# Patient Record
Sex: Male | Born: 1955
Health system: Southern US, Community
[De-identification: ages and names within clinical notes are randomized; demographics above are authoritative.]

## PROBLEM LIST (undated history)

## (undated) DIAGNOSIS — J302 Other seasonal allergic rhinitis: Secondary | ICD-10-CM

## (undated) DIAGNOSIS — C4490 Unspecified malignant neoplasm of skin, unspecified: Secondary | ICD-10-CM

## (undated) DIAGNOSIS — T7840XA Allergy, unspecified, initial encounter: Secondary | ICD-10-CM

## (undated) DIAGNOSIS — D128 Benign neoplasm of rectum: Secondary | ICD-10-CM

## (undated) HISTORY — PX: VASECTOMY: SHX75

## (undated) HISTORY — PX: OTHER SURGICAL HISTORY: SHX169

## (undated) HISTORY — DX: Unspecified malignant neoplasm of skin, unspecified: C44.90

## (undated) HISTORY — DX: Other seasonal allergic rhinitis: J30.2

## (undated) HISTORY — DX: Allergy, unspecified, initial encounter: T78.40XA

---

## 2004-12-12 ENCOUNTER — Ambulatory Visit: Payer: Self-pay | Admitting: Gastroenterology

## 2004-12-13 ENCOUNTER — Ambulatory Visit: Payer: Self-pay | Admitting: Gastroenterology

## 2004-12-13 ENCOUNTER — Encounter (INDEPENDENT_AMBULATORY_CARE_PROVIDER_SITE_OTHER): Payer: Self-pay | Admitting: Specialist

## 2005-01-12 ENCOUNTER — Ambulatory Visit: Payer: Self-pay | Admitting: Gastroenterology

## 2005-03-23 ENCOUNTER — Ambulatory Visit (HOSPITAL_COMMUNITY): Admission: RE | Admit: 2005-03-23 | Discharge: 2005-03-23 | Payer: Self-pay | Admitting: *Deleted

## 2007-05-14 ENCOUNTER — Ambulatory Visit: Payer: Self-pay | Admitting: Gastroenterology

## 2007-05-28 ENCOUNTER — Ambulatory Visit: Payer: Self-pay | Admitting: Gastroenterology

## 2008-03-16 ENCOUNTER — Ambulatory Visit (HOSPITAL_BASED_OUTPATIENT_CLINIC_OR_DEPARTMENT_OTHER): Admission: RE | Admit: 2008-03-16 | Discharge: 2008-03-16 | Payer: Self-pay | Admitting: Specialist

## 2010-11-14 NOTE — Op Note (Signed)
Rodney Whitney, Rodney Whitney NO.:  [id number]   MEDICAL RECORD NO.:  [id number]          PATIENT TYPE:  AMB   LOCATION:  [facility]                         FACILITY:  [facility]   PHYSICIAN:  Jene Every, M.D.    DATE OF BIRTH:  1955-12-07   DATE OF PROCEDURE:  03/16/2008  DATE OF DISCHARGE:                               OPERATIVE REPORT   PREOPERATIVE DIAGNOSIS:  Medial meniscus tear right knee.   POSTOPERATIVE DIAGNOSIS:  Medial meniscus tear right knee, grade III  chondromalacia lateral femoral condyle, grade III chondromalacia medial  tibial plateau.   PROCEDURE:  1. Right knee arthroscopy.  2. Partial medial meniscectomy.  3. Chondroplasty of lateral femoral condyle, medial tibial plateau.   ANESTHESIA:  General   SURGEON:  Jene Every, M.D.   ASSISTANT:  None.   BRIEF HISTORY AND INDICATION:  A 55 year old with mechanical symptoms,  medial meniscus tear noted, indicated for partial resection.  Risks and  benefits discussed including bleeding, infection, damage to  neurovascular structures, no change in symptoms, worsening of symptoms,  need for repeat debridement, anesthetic complication, DVT, PE, etc.   PROCEDURE IN DETAIL:  With the patient in supine position after  induction of adequate level of general anesthesia, 2 grams of Kefzol,  the right lower extremity was prepped and draped in the usual sterile  fashion.  A lateral parapatellar portal and a superomedial parapatellar  portal was fashioned with a #11 blade.  Ingress cannula atraumatically  placed.  Irrigant was utilized to insufflate the joint.  Under direct  visualization a medial parapatellar portal was fashioned with a #11  blade after localization with an 18 gauge needle sparing the medial  meniscus.  Noted was a complex tear of the posterior half of the medial  meniscus.  Straight basket rongeurs was introduced and utilized to  perform a partial medial meniscectomy to a stable base.  The inner  one  half of the posterior half of the medial meniscus was resected.  It was  not repairable.  Next the lateral compartment revealed interestingly a  grade III lesion along the posterolateral femoral condyle with chondral  flap tears noted.  These were excised with a straight basket and  contoured to a stable base utilizing a 4.2 Cuda shaver.  The mild radial  tearing of the lateral meniscus was shaved with a 4.2 Cuda shaver.  The  remaining remnant of the meniscus was stable to probe palpation and  tibial plateau was unremarkable.  ACL and PCL were unremarkable.  Mild  patellofemoral degenerative changes.  Normal patellofemoral articulation  back to the medial compartment.  A remnant stable to probe palpation.  Some grade III changes of the tibial plateau.  Chondroplasty was  performed here as well.  There was no further pathology amenable to  surgical intervention.  The knee was copiously lavaged, all  instrumentation was removed.  Portals were closed  with 4-0 nylon simple sutures and quarter percent Marcaine with  epinephrine was infiltrated in the joint.  The wound was dressed  sterilely.  He was awakened without difficulty and transported to  the  recovery room in satisfactory condition.  The patient tolerated the  procedure with no complication.      Jene Every, M.D.  Electronically Signed     JB/MEDQ  D:  03/16/2008  T:  03/18/2008  Job:  [id number]

## 2011-08-28 ENCOUNTER — Other Ambulatory Visit: Payer: Self-pay | Admitting: Dermatology

## 2014-07-28 ENCOUNTER — Other Ambulatory Visit (HOSPITAL_COMMUNITY): Payer: Self-pay | Admitting: Urology

## 2014-07-28 DIAGNOSIS — R868 Other abnormal findings in specimens from male genital organs: Secondary | ICD-10-CM

## 2014-07-28 DIAGNOSIS — R361 Hematospermia: Secondary | ICD-10-CM

## 2014-07-28 DIAGNOSIS — N5089 Other specified disorders of the male genital organs: Secondary | ICD-10-CM

## 2014-08-13 ENCOUNTER — Ambulatory Visit (HOSPITAL_COMMUNITY): Payer: BLUE CROSS/BLUE SHIELD

## 2015-07-18 ENCOUNTER — Other Ambulatory Visit: Payer: Self-pay | Admitting: Otolaryngology

## 2015-07-19 ENCOUNTER — Other Ambulatory Visit: Payer: Self-pay | Admitting: Otolaryngology

## 2015-07-19 DIAGNOSIS — H9190 Unspecified hearing loss, unspecified ear: Secondary | ICD-10-CM

## 2015-07-28 ENCOUNTER — Ambulatory Visit
Admission: RE | Admit: 2015-07-28 | Discharge: 2015-07-28 | Disposition: A | Discharge status: Home / Self Care | Payer: BLUE CROSS/BLUE SHIELD | Source: Ambulatory Visit | Attending: Otolaryngology | Admitting: Otolaryngology

## 2015-07-28 DIAGNOSIS — H9190 Unspecified hearing loss, unspecified ear: Secondary | ICD-10-CM

## 2016-03-28 DIAGNOSIS — L570 Actinic keratosis: Secondary | ICD-10-CM | POA: Diagnosis not present

## 2016-03-28 DIAGNOSIS — D225 Melanocytic nevi of trunk: Secondary | ICD-10-CM | POA: Diagnosis not present

## 2016-03-28 DIAGNOSIS — I788 Other diseases of capillaries: Secondary | ICD-10-CM | POA: Diagnosis not present

## 2016-03-28 DIAGNOSIS — L814 Other melanin hyperpigmentation: Secondary | ICD-10-CM | POA: Diagnosis not present

## 2016-03-28 DIAGNOSIS — D224 Melanocytic nevi of scalp and neck: Secondary | ICD-10-CM | POA: Diagnosis not present

## 2016-04-25 DIAGNOSIS — Z23 Encounter for immunization: Secondary | ICD-10-CM | POA: Diagnosis not present

## 2016-04-25 DIAGNOSIS — Z859 Personal history of malignant neoplasm, unspecified: Secondary | ICD-10-CM | POA: Diagnosis not present

## 2016-04-25 DIAGNOSIS — N442 Benign cyst of testis: Secondary | ICD-10-CM | POA: Diagnosis not present

## 2016-04-25 DIAGNOSIS — J302 Other seasonal allergic rhinitis: Secondary | ICD-10-CM | POA: Diagnosis not present

## 2016-05-01 DIAGNOSIS — N4341 Spermatocele of epididymis, single: Secondary | ICD-10-CM | POA: Diagnosis not present

## 2016-05-01 DIAGNOSIS — R946 Abnormal results of thyroid function studies: Secondary | ICD-10-CM | POA: Diagnosis not present

## 2016-05-01 DIAGNOSIS — Z0000 Encounter for general adult medical examination without abnormal findings: Secondary | ICD-10-CM | POA: Diagnosis not present

## 2016-05-01 DIAGNOSIS — N5201 Erectile dysfunction due to arterial insufficiency: Secondary | ICD-10-CM | POA: Diagnosis not present

## 2016-05-01 DIAGNOSIS — Z125 Encounter for screening for malignant neoplasm of prostate: Secondary | ICD-10-CM | POA: Diagnosis not present

## 2016-05-08 DIAGNOSIS — R1310 Dysphagia, unspecified: Secondary | ICD-10-CM | POA: Diagnosis not present

## 2016-05-08 DIAGNOSIS — Z1389 Encounter for screening for other disorder: Secondary | ICD-10-CM | POA: Diagnosis not present

## 2016-05-08 DIAGNOSIS — Z859 Personal history of malignant neoplasm, unspecified: Secondary | ICD-10-CM | POA: Diagnosis not present

## 2016-05-08 DIAGNOSIS — N442 Benign cyst of testis: Secondary | ICD-10-CM | POA: Diagnosis not present

## 2016-05-08 DIAGNOSIS — Z0000 Encounter for general adult medical examination without abnormal findings: Secondary | ICD-10-CM | POA: Diagnosis not present

## 2016-09-10 DIAGNOSIS — Z6830 Body mass index (BMI) 30.0-30.9, adult: Secondary | ICD-10-CM | POA: Diagnosis not present

## 2016-09-10 DIAGNOSIS — R202 Paresthesia of skin: Secondary | ICD-10-CM | POA: Diagnosis not present

## 2016-09-10 DIAGNOSIS — M25512 Pain in left shoulder: Secondary | ICD-10-CM | POA: Diagnosis not present

## 2016-10-16 DIAGNOSIS — D0462 Carcinoma in situ of skin of left upper limb, including shoulder: Secondary | ICD-10-CM | POA: Diagnosis not present

## 2016-10-16 DIAGNOSIS — L570 Actinic keratosis: Secondary | ICD-10-CM | POA: Diagnosis not present

## 2016-12-10 DIAGNOSIS — Z6829 Body mass index (BMI) 29.0-29.9, adult: Secondary | ICD-10-CM | POA: Diagnosis not present

## 2016-12-10 DIAGNOSIS — Z91030 Bee allergy status: Secondary | ICD-10-CM | POA: Diagnosis not present

## 2017-04-01 DIAGNOSIS — Z85828 Personal history of other malignant neoplasm of skin: Secondary | ICD-10-CM | POA: Diagnosis not present

## 2017-04-01 DIAGNOSIS — L570 Actinic keratosis: Secondary | ICD-10-CM | POA: Diagnosis not present

## 2017-04-01 DIAGNOSIS — L821 Other seborrheic keratosis: Secondary | ICD-10-CM | POA: Diagnosis not present

## 2017-04-01 DIAGNOSIS — L720 Epidermal cyst: Secondary | ICD-10-CM | POA: Diagnosis not present

## 2017-04-18 DIAGNOSIS — R946 Abnormal results of thyroid function studies: Secondary | ICD-10-CM | POA: Diagnosis not present

## 2017-04-18 DIAGNOSIS — Z125 Encounter for screening for malignant neoplasm of prostate: Secondary | ICD-10-CM | POA: Diagnosis not present

## 2017-04-18 DIAGNOSIS — Z0000 Encounter for general adult medical examination without abnormal findings: Secondary | ICD-10-CM | POA: Diagnosis not present

## 2017-04-18 DIAGNOSIS — Z23 Encounter for immunization: Secondary | ICD-10-CM | POA: Diagnosis not present

## 2017-05-08 DIAGNOSIS — N5201 Erectile dysfunction due to arterial insufficiency: Secondary | ICD-10-CM | POA: Diagnosis not present

## 2017-05-08 DIAGNOSIS — Z125 Encounter for screening for malignant neoplasm of prostate: Secondary | ICD-10-CM | POA: Diagnosis not present

## 2017-05-14 DIAGNOSIS — J019 Acute sinusitis, unspecified: Secondary | ICD-10-CM | POA: Diagnosis not present

## 2017-05-14 DIAGNOSIS — M48061 Spinal stenosis, lumbar region without neurogenic claudication: Secondary | ICD-10-CM | POA: Diagnosis not present

## 2017-05-14 DIAGNOSIS — Z859 Personal history of malignant neoplasm, unspecified: Secondary | ICD-10-CM | POA: Diagnosis not present

## 2017-05-14 DIAGNOSIS — J302 Other seasonal allergic rhinitis: Secondary | ICD-10-CM | POA: Diagnosis not present

## 2017-05-14 DIAGNOSIS — Z1389 Encounter for screening for other disorder: Secondary | ICD-10-CM | POA: Diagnosis not present

## 2017-05-14 DIAGNOSIS — Z0000 Encounter for general adult medical examination without abnormal findings: Secondary | ICD-10-CM | POA: Diagnosis not present

## 2017-05-22 ENCOUNTER — Encounter: Payer: Self-pay | Admitting: Gastroenterology

## 2017-06-13 ENCOUNTER — Encounter: Payer: Self-pay | Admitting: Internal Medicine

## 2017-06-28 DIAGNOSIS — H5212 Myopia, left eye: Secondary | ICD-10-CM | POA: Diagnosis not present

## 2017-07-18 DIAGNOSIS — H43821 Vitreomacular adhesion, right eye: Secondary | ICD-10-CM | POA: Diagnosis not present

## 2017-07-18 DIAGNOSIS — H2511 Age-related nuclear cataract, right eye: Secondary | ICD-10-CM | POA: Diagnosis not present

## 2017-07-18 DIAGNOSIS — H52223 Regular astigmatism, bilateral: Secondary | ICD-10-CM | POA: Diagnosis not present

## 2017-08-16 DIAGNOSIS — H35371 Puckering of macula, right eye: Secondary | ICD-10-CM | POA: Diagnosis not present

## 2017-08-16 DIAGNOSIS — H2511 Age-related nuclear cataract, right eye: Secondary | ICD-10-CM | POA: Diagnosis not present

## 2018-04-01 DIAGNOSIS — L570 Actinic keratosis: Secondary | ICD-10-CM | POA: Diagnosis not present

## 2018-04-01 DIAGNOSIS — L814 Other melanin hyperpigmentation: Secondary | ICD-10-CM | POA: Diagnosis not present

## 2018-04-01 DIAGNOSIS — L821 Other seborrheic keratosis: Secondary | ICD-10-CM | POA: Diagnosis not present

## 2018-04-01 DIAGNOSIS — C4441 Basal cell carcinoma of skin of scalp and neck: Secondary | ICD-10-CM | POA: Diagnosis not present

## 2018-04-01 DIAGNOSIS — D225 Melanocytic nevi of trunk: Secondary | ICD-10-CM | POA: Diagnosis not present

## 2018-04-01 DIAGNOSIS — C44212 Basal cell carcinoma of skin of right ear and external auricular canal: Secondary | ICD-10-CM | POA: Diagnosis not present

## 2018-05-07 DIAGNOSIS — C44212 Basal cell carcinoma of skin of right ear and external auricular canal: Secondary | ICD-10-CM | POA: Diagnosis not present

## 2018-05-12 DIAGNOSIS — Z0000 Encounter for general adult medical examination without abnormal findings: Secondary | ICD-10-CM | POA: Diagnosis not present

## 2018-05-12 DIAGNOSIS — R82998 Other abnormal findings in urine: Secondary | ICD-10-CM | POA: Diagnosis not present

## 2018-05-12 DIAGNOSIS — R946 Abnormal results of thyroid function studies: Secondary | ICD-10-CM | POA: Diagnosis not present

## 2018-05-15 DIAGNOSIS — Z125 Encounter for screening for malignant neoplasm of prostate: Secondary | ICD-10-CM | POA: Diagnosis not present

## 2018-05-15 DIAGNOSIS — N5201 Erectile dysfunction due to arterial insufficiency: Secondary | ICD-10-CM | POA: Diagnosis not present

## 2018-05-20 DIAGNOSIS — Z0000 Encounter for general adult medical examination without abnormal findings: Secondary | ICD-10-CM | POA: Diagnosis not present

## 2018-05-20 DIAGNOSIS — M25511 Pain in right shoulder: Secondary | ICD-10-CM | POA: Diagnosis not present

## 2018-05-20 DIAGNOSIS — R1319 Other dysphagia: Secondary | ICD-10-CM | POA: Diagnosis not present

## 2018-05-20 DIAGNOSIS — N442 Benign cyst of testis: Secondary | ICD-10-CM | POA: Diagnosis not present

## 2018-05-20 DIAGNOSIS — Z1212 Encounter for screening for malignant neoplasm of rectum: Secondary | ICD-10-CM | POA: Diagnosis not present

## 2018-05-20 DIAGNOSIS — Z23 Encounter for immunization: Secondary | ICD-10-CM | POA: Diagnosis not present

## 2018-05-20 DIAGNOSIS — Z859 Personal history of malignant neoplasm, unspecified: Secondary | ICD-10-CM | POA: Diagnosis not present

## 2018-05-20 DIAGNOSIS — Z1389 Encounter for screening for other disorder: Secondary | ICD-10-CM | POA: Diagnosis not present

## 2018-05-21 DIAGNOSIS — Z4802 Encounter for removal of sutures: Secondary | ICD-10-CM | POA: Diagnosis not present

## 2018-07-03 ENCOUNTER — Encounter: Payer: Self-pay | Admitting: Gastroenterology

## 2018-08-25 ENCOUNTER — Encounter: Payer: BLUE CROSS/BLUE SHIELD | Admitting: Gastroenterology

## 2018-09-10 ENCOUNTER — Encounter: Payer: Self-pay | Admitting: Gastroenterology

## 2018-09-10 ENCOUNTER — Other Ambulatory Visit: Payer: Self-pay

## 2018-09-10 ENCOUNTER — Ambulatory Visit (AMBULATORY_SURGERY_CENTER): Payer: Self-pay | Admitting: *Deleted

## 2018-09-10 VITALS — Ht 71.0 in | Wt 210.8 lb

## 2018-09-10 DIAGNOSIS — Z1211 Encounter for screening for malignant neoplasm of colon: Secondary | ICD-10-CM

## 2018-09-10 MED ORDER — NA SULFATE-K SULFATE-MG SULF 17.5-3.13-1.6 GM/177ML PO SOLN: 1.0000 | Freq: Once | ORAL | 0 refills | Status: AC

## 2018-09-10 NOTE — Progress Notes (Signed)
Denies allergies to eggs or soy products. Denies complications with sedation or anesthesia. Denies O2 use. Denies use of diet or weight loss medications.  Emmi instructions given for colonoscopy.  

## 2018-09-24 ENCOUNTER — Encounter: Payer: BLUE CROSS/BLUE SHIELD | Admitting: Gastroenterology

## 2018-10-07 ENCOUNTER — Telehealth: Payer: Self-pay | Admitting: *Deleted

## 2018-10-07 NOTE — Telephone Encounter (Signed)
Left second message regarding canceling his colonoscopy due to Covid 19. No answer. Will cancel procedure. SM

## 2018-10-07 NOTE — Telephone Encounter (Signed)
No answer and left message to reschedule the patient for later appointment time related to COVID 19. Will await pt to call for confirmation. SM

## 2018-10-15 ENCOUNTER — Encounter: Payer: BLUE CROSS/BLUE SHIELD | Admitting: Gastroenterology

## 2018-10-21 DIAGNOSIS — M79662 Pain in left lower leg: Secondary | ICD-10-CM | POA: Diagnosis not present

## 2018-11-11 ENCOUNTER — Telehealth: Payer: Self-pay | Admitting: *Deleted

## 2018-11-11 NOTE — Telephone Encounter (Signed)
No answer for the second attempt for his COVID 19 screening. Left pt a message regarding the care partner policy and also told him to bring a mask with him if he has one available. SM

## 2018-11-11 NOTE — Telephone Encounter (Signed)
No answer for the first attempt of the COVID screening. Will call back. SM

## 2018-11-12 ENCOUNTER — Telehealth: Payer: Self-pay | Admitting: Gastroenterology

## 2018-11-12 ENCOUNTER — Encounter: Payer: BLUE CROSS/BLUE SHIELD | Admitting: Gastroenterology

## 2018-11-12 NOTE — Telephone Encounter (Signed)
Okay thanks for letting me know

## 2018-12-18 DIAGNOSIS — M7662 Achilles tendinitis, left leg: Secondary | ICD-10-CM | POA: Diagnosis not present

## 2019-03-02 ENCOUNTER — Encounter: Payer: Self-pay | Admitting: Gastroenterology

## 2019-03-13 DIAGNOSIS — Z23 Encounter for immunization: Secondary | ICD-10-CM | POA: Diagnosis not present

## 2019-03-23 ENCOUNTER — Other Ambulatory Visit: Payer: Self-pay

## 2019-03-23 ENCOUNTER — Ambulatory Visit (AMBULATORY_SURGERY_CENTER): Payer: Self-pay

## 2019-03-23 VITALS — Temp 96.6°F | Ht 71.0 in | Wt 201.0 lb

## 2019-03-23 DIAGNOSIS — Z1211 Encounter for screening for malignant neoplasm of colon: Secondary | ICD-10-CM

## 2019-03-23 MED ORDER — NA SULFATE-K SULFATE-MG SULF 17.5-3.13-1.6 GM/177ML PO SOLN: 1.0000 | Freq: Once | ORAL | 0 refills | Status: AC

## 2019-03-23 NOTE — Progress Notes (Signed)
Denies allergies to eggs or soy products. Denies complication of anesthesia or sedation. Denies use of weight loss medication. Denies use of O2.   Emmi instructions given for colonoscopy.  Patient already had Suprep at home. He had to reschedule due to Covid 19.

## 2019-03-24 ENCOUNTER — Encounter: Payer: Self-pay | Admitting: Gastroenterology

## 2019-03-31 DIAGNOSIS — M7662 Achilles tendinitis, left leg: Secondary | ICD-10-CM | POA: Diagnosis not present

## 2019-04-01 DIAGNOSIS — S86011D Strain of right Achilles tendon, subsequent encounter: Secondary | ICD-10-CM | POA: Diagnosis not present

## 2019-04-03 ENCOUNTER — Encounter: Payer: Self-pay | Admitting: Gastroenterology

## 2019-04-03 DIAGNOSIS — S86011D Strain of right Achilles tendon, subsequent encounter: Secondary | ICD-10-CM | POA: Diagnosis not present

## 2019-04-06 ENCOUNTER — Other Ambulatory Visit: Payer: Self-pay

## 2019-04-06 ENCOUNTER — Ambulatory Visit (AMBULATORY_SURGERY_CENTER): Payer: BC Managed Care – PPO | Admitting: Gastroenterology

## 2019-04-06 ENCOUNTER — Encounter: Payer: Self-pay | Admitting: Gastroenterology

## 2019-04-06 VITALS — BP 131/78 | HR 78 | Temp 97.5°F | Resp 16 | Ht 71.0 in | Wt 201.0 lb

## 2019-04-06 DIAGNOSIS — D127 Benign neoplasm of rectosigmoid junction: Secondary | ICD-10-CM | POA: Diagnosis not present

## 2019-04-06 DIAGNOSIS — D125 Benign neoplasm of sigmoid colon: Secondary | ICD-10-CM

## 2019-04-06 DIAGNOSIS — D123 Benign neoplasm of transverse colon: Secondary | ICD-10-CM

## 2019-04-06 DIAGNOSIS — Z1211 Encounter for screening for malignant neoplasm of colon: Secondary | ICD-10-CM

## 2019-04-06 MED ORDER — SODIUM CHLORIDE 0.9 % IV SOLN: 500.0000 mL | Freq: Once | INTRAVENOUS | Status: DC

## 2019-04-06 NOTE — Progress Notes (Signed)
Called to room to assist during endoscopic procedure.  Patient ID and intended procedure confirmed with present staff. Received instructions for my participation in the procedure from the performing physician.  

## 2019-04-06 NOTE — Progress Notes (Signed)
Pt's states no medical or surgical changes since previsit or office visit. 

## 2019-04-06 NOTE — Progress Notes (Signed)
A and O x3. Report to RN. Tolerated MAC anesthesia well.

## 2019-04-06 NOTE — Op Note (Signed)
[facility] Patient Name: Rodney Whitney Procedure Date: 04/06/2019 9:57 AM MRN: [id number] Endoscopist: Rodney Whitney , MD Age: 63 Referring MD:  Date of Birth: 08-27-1955 Gender: Male Account #: [id number] Procedure:                Colonoscopy Indications:              Screening for colorectal malignant neoplasm Medicines:                Monitored Anesthesia Care Procedure:                Pre-Anesthesia Assessment:                           - Prior to the procedure, a History and Physical                            was performed, and patient medications and                            allergies were reviewed. The patient's tolerance of                            previous anesthesia was also reviewed. The risks                            and benefits of the procedure and the sedation                            options and risks were discussed with the patient.                            All questions were answered, and informed consent                            was obtained. Prior Anticoagulants: The patient has                            taken no previous anticoagulant or antiplatelet                            agents. ASA Grade Assessment: II - A patient with                            mild systemic disease. After reviewing the risks                            and benefits, the patient was deemed in                            satisfactory condition to undergo the procedure.                           After obtaining informed consent, the colonoscope  was passed under direct vision. Throughout the                            procedure, the patient's blood pressure, pulse, and                            oxygen saturations were monitored continuously. The                            Colonoscope was introduced through the anus and                            advanced to the the cecum, identified by                            appendiceal orifice and  ileocecal valve. The                            colonoscopy was performed without difficulty. The                            patient tolerated the procedure well. The quality                            of the bowel preparation was good. The ileocecal                            valve, appendiceal orifice, and rectum were                            photographed. Scope In: 10:07:18 AM Scope Out: 10:29:36 AM Scope Withdrawal Time: 0 hours 15 minutes 48 seconds  Total Procedure Duration: 0 hours 22 minutes 18 seconds  Findings:                 The perianal and digital rectal examinations were                            normal.                           Two sessile polyps were found in the transverse                            colon. The polyps were 3 to 5 mm in size. These                            polyps were removed with a cold snare. Resection                            and retrieval were complete.                           A 6 mm polyp was found in the sigmoid colon. The  polyp was sessile. The polyp was removed with a                            cold snare. Resection and retrieval were complete.                           A 6 mm polyp was found in the recto-sigmoid colon.                            The polyp was sessile. The polyp was removed with a                            cold snare. Resection and retrieval were complete.                           A large polyp was found at the proximal rectum,                            junction of recto-sigmoid colon, few cm's in size.                            The polyp was sessile although difficult to clearly                            see the base of it given the location. It appeared                            lobulated with a questionable area of central                            depression versus normal lobulation of the polyp.                            Biopsies were taken with a cold forceps for                             histology, the tissue was soft. Area across the                            lumen and inferior to the polyp was tattooed with                            an injection of Spot (carbon black).                           A few small-mouthed diverticula were found in the                            entire colon.                           Internal hemorrhoids were found during  retroflexion. The hemorrhoids were small.                           The exam was otherwise without abnormality. Complications:            No immediate complications. Estimated blood loss:                            Minimal. Estimated Blood Loss:     Estimated blood loss was minimal. Impression:               - Two 3 to 5 mm polyps in the transverse colon,                            removed with a cold snare. Resected and retrieved.                           - One 6 mm polyp in the sigmoid colon, removed with                            a cold snare. Resected and retrieved.                           - One 6 mm polyp at the recto-sigmoid colon,                            removed with a cold snare. Resected and retrieved.                           - One large polyp in the rectum at the                            recto-sigmoid colon as described above. I suspect                            this is more than likely a benign adenomatous polyp                            however biopsied to ensure no malignancy. It was                            not removed today, would prefer to remove at the                            hospital if amenable to endoscopic resection.                            Tattooed.                           - Diverticulosis in the entire examined colon.                           - Internal hemorrhoids.                           -  The examination was otherwise normal. Recommendation:           - Patient has a contact number available for                            emergencies. The  signs and symptoms of potential                            delayed complications were discussed with the                            patient. Return to normal activities tomorrow.                            Written discharge instructions were provided to the                            patient.                           - Resume previous diet.                           - Continue present medications.                           - Await pathology results. As above, pending benign                            pathology for large polypoid lesion would plan for                            EMR to remove it at the hospital in the next few                            weeks Rodney Lipps P. Armbruster, MD 04/06/2019 10:39:16 AM This report has been signed electronically.

## 2019-04-06 NOTE — Patient Instructions (Signed)
YOU HAD AN ENDOSCOPIC PROCEDURE TODAY AT [facility]:   Refer to the procedure report that was given to you for any specific questions about what was found during the examination.  If the procedure report does not answer your questions, please call your gastroenterologist to clarify.  If you requested that your care partner not be given the details of your procedure findings, then the procedure report has been included in a sealed envelope for you to review at your convenience later.  YOU SHOULD EXPECT: Some feelings of bloating in the abdomen. Passage of more gas than usual.  Walking can help get rid of the air that was put into your GI tract during the procedure and reduce the bloating. If you had a lower endoscopy (such as a colonoscopy or flexible sigmoidoscopy) you may notice spotting of blood in your stool or on the toilet paper. If you underwent a bowel prep for your procedure, you may not have a normal bowel movement for a few days.  Please Note:  You might notice some irritation and congestion in your nose or some drainage.  This is from the oxygen used during your procedure.  There is no need for concern and it should clear up in a day or so.  SYMPTOMS TO REPORT IMMEDIATELY:   Following lower endoscopy (colonoscopy or flexible sigmoidoscopy):  Excessive amounts of blood in the stool  Significant tenderness or worsening of abdominal pains  Swelling of the abdomen that is new, acute  Fever of 100F or higher  For urgent or emergent issues, a gastroenterologist can be reached at any hour by calling [phone].   DIET:  We do recommend a small meal at first, but then you may proceed to your regular diet.  Drink plenty of fluids but you should avoid alcoholic beverages for 24 hours.  MEDICATIONS: Continue present medications.  FOLLOW UP: Await pathology results. Pending benign pathology for the large polypoid lesion, would plan for EMR to remove it in the hospital in  the next few weeks.  ACTIVITY:  You should plan to take it easy for the rest of today and you should NOT DRIVE or use heavy machinery until tomorrow (because of the sedation medicines used during the test).    FOLLOW UP: Our staff will call the number listed on your records 48-72 hours following your procedure to check on you and address any questions or concerns that you may have regarding the information given to you following your procedure. If we do not reach you, we will leave a message.  We will attempt to reach you two times.  During this call, we will ask if you have developed any symptoms of COVID 19. If you develop any symptoms (ie: fever, flu-like symptoms, shortness of breath, cough etc.) before then, please call [phone].  If you test positive for Covid 19 in the 2 weeks post procedure, please call and report this information to [place].    If any biopsies were taken you will be contacted by phone or by letter within the next 1-3 weeks.  Please call us at [phone] if you have not heard about the biopsies in 3 weeks.   Thank you for allowing [place] to provide for your healthcare needs today.   SIGNATURES/CONFIDENTIALITY: You and/or your care partner have signed paperwork which will be entered into your electronic medical record.  These signatures attest to the fact that that the information above on your After Visit Summary has been reviewed  and is understood.  Full responsibility of the confidentiality of this discharge information lies with you and/or your care-partner. 

## 2019-04-07 DIAGNOSIS — D225 Melanocytic nevi of trunk: Secondary | ICD-10-CM | POA: Diagnosis not present

## 2019-04-07 DIAGNOSIS — L570 Actinic keratosis: Secondary | ICD-10-CM | POA: Diagnosis not present

## 2019-04-07 DIAGNOSIS — Z85828 Personal history of other malignant neoplasm of skin: Secondary | ICD-10-CM | POA: Diagnosis not present

## 2019-04-07 DIAGNOSIS — L821 Other seborrheic keratosis: Secondary | ICD-10-CM | POA: Diagnosis not present

## 2019-04-08 ENCOUNTER — Telehealth: Payer: Self-pay

## 2019-04-08 DIAGNOSIS — S86011D Strain of right Achilles tendon, subsequent encounter: Secondary | ICD-10-CM | POA: Diagnosis not present

## 2019-04-08 NOTE — Telephone Encounter (Signed)
Second post procedure follow up call, no answer 

## 2019-04-08 NOTE — Telephone Encounter (Signed)
Attempted to reach patient for post-procedure f/u call. No answer. Left message that we will make another attempt to reach him again later today and for him to please not hesitate to call us if he has any questions/concerns regarding his care. 

## 2019-04-09 ENCOUNTER — Telehealth: Payer: Self-pay | Admitting: Gastroenterology

## 2019-04-09 NOTE — Telephone Encounter (Signed)
Called patient - recommendations in pathology result note

## 2019-04-10 ENCOUNTER — Other Ambulatory Visit: Payer: Self-pay

## 2019-04-10 DIAGNOSIS — Z1211 Encounter for screening for malignant neoplasm of colon: Secondary | ICD-10-CM

## 2019-04-10 DIAGNOSIS — S86011D Strain of right Achilles tendon, subsequent encounter: Secondary | ICD-10-CM | POA: Diagnosis not present

## 2019-04-10 DIAGNOSIS — K7460 Unspecified cirrhosis of liver: Secondary | ICD-10-CM

## 2019-04-14 ENCOUNTER — Other Ambulatory Visit (INDEPENDENT_AMBULATORY_CARE_PROVIDER_SITE_OTHER): Payer: BC Managed Care – PPO

## 2019-04-14 DIAGNOSIS — K7460 Unspecified cirrhosis of liver: Secondary | ICD-10-CM

## 2019-04-14 DIAGNOSIS — S86011D Strain of right Achilles tendon, subsequent encounter: Secondary | ICD-10-CM | POA: Diagnosis not present

## 2019-04-14 DIAGNOSIS — R188 Other ascites: Secondary | ICD-10-CM

## 2019-04-14 LAB — BASIC METABOLIC PANEL
BUN: 14 mg/dL (ref 6–23)
CO2: 24 mEq/L (ref 19–32)
Calcium: 9.6 mg/dL (ref 8.4–10.5)
Chloride: 104 mEq/L (ref 96–112)
Creatinine, Ser: 0.84 mg/dL (ref 0.40–1.50)
GFR: 92 mL/min (ref >60.00)
Glucose, Bld: 99 mg/dL (ref 70–99)
Potassium: 4 mEq/L (ref 3.5–5.1)
Sodium: 137 mEq/L (ref 135–145)

## 2019-04-16 ENCOUNTER — Ambulatory Visit (HOSPITAL_COMMUNITY)
Admission: RE | Admit: 2019-04-16 | Discharge: 2019-04-16 | Disposition: A | Discharge status: Home / Self Care | Payer: BC Managed Care – PPO | Source: Ambulatory Visit | Attending: Gastroenterology | Admitting: Gastroenterology

## 2019-04-16 DIAGNOSIS — K621 Rectal polyp: Secondary | ICD-10-CM | POA: Diagnosis not present

## 2019-04-16 DIAGNOSIS — Z1211 Encounter for screening for malignant neoplasm of colon: Secondary | ICD-10-CM

## 2019-04-16 MED ORDER — IOHEXOL 300 MG/ML  SOLN
100.0000 mL | Freq: Once | INTRAMUSCULAR | Status: AC | PRN
  Administered 2019-04-16: 100 mL via INTRAVENOUS

## 2019-04-16 MED ORDER — SODIUM CHLORIDE (PF) 0.9 % IJ SOLN
INTRAMUSCULAR | Status: AC
  Filled 2019-04-16: qty 50

## 2019-04-17 DIAGNOSIS — S86011D Strain of right Achilles tendon, subsequent encounter: Secondary | ICD-10-CM | POA: Diagnosis not present

## 2019-04-20 DIAGNOSIS — S86011D Strain of right Achilles tendon, subsequent encounter: Secondary | ICD-10-CM | POA: Diagnosis not present

## 2019-04-22 ENCOUNTER — Telehealth: Payer: Self-pay

## 2019-04-22 DIAGNOSIS — H5212 Myopia, left eye: Secondary | ICD-10-CM | POA: Diagnosis not present

## 2019-04-22 NOTE — Telephone Encounter (Signed)
Appt made to see Dr Rush Landmark to discuss EMR polyp resection on 06/02/19 at 950 am.  Pt advised

## 2019-04-22 NOTE — Telephone Encounter (Signed)
Mansouraty, Telford Nab., MD  Armbruster, Carlota Raspberry, MD; Timothy Lasso, RN        Richardson Landry,  Sounds like a plan.   Tanicia Wolaver, move forward with finding a Flex-Sigmoidoscopy with EMR slot (still needs full colon preparation) in coming weeks.  Please set up a clinic appointment with me to discuss EMR and Advanced polyp resection techniques.  Please let us know when the appointment(s) are set up.  Thanks.  GM

## 2019-04-22 NOTE — Telephone Encounter (Signed)
-----   Message from Yetta Flock, MD sent at 04/21/2019  6:00 PM EDT ----- Spoke with the patient and he wants to proceed. Will do it whenever you are free to do it.   Kaneisha Ellenberger, can you help schedule flex sig with EMR at the hospital with Dr. Rush Landmark?  Thanks much for your help! Richardson Landry ----- Message ----- From: Irving Copas., MD Sent: 04/21/2019   1:22 PM EDT To: Yetta Flock, MD  Once you let me know, Chong Sicilian will work on moving things forward. GM ----- Message ----- From: Yetta Flock, MD Sent: 04/21/2019   1:16 PM EDT To: Irving Copas., MD  Gabe thanks. I appreciate it. I think okay to proceed in 4-6 weeks with you, I'll call him and let you know.  I do think it is very likely benign and amenable to EMR. Thanks again,  Richardson Landry  ----- Message ----- From: Irving Copas., MD Sent: 04/21/2019   1:13 PM EDT To: Yetta Flock, MD  Richardson Landry, Thanks for reaching out. Worthwhile for an endoscopic attempt at resection. If the CT is negative for significant issues, I think I would proceed with a Flex-Sigmiodoscopy with EMR attempt. EUS could be considered to ensure no deeper lesion is noted, but not always needed if CT is unremarkable (though we recognize it is not as sensitive as a Pelvic MRI). The timing for procedures will be 4-6 weeks most likely but I think we can probably get in before the new year. Let me know and then I'll let Kerie Badger work on scheduling a clinic visit and putting him on the Procedure List. Thanks. GM ----- Message ----- From: Yetta Flock, MD Sent: 04/20/2019   6:02 PM EDT To: Irving Copas., MD  Gabe can you take a look at this patient's colonoscopy when you have a chance. Rectosigmoid polyp, a few cm in size, biopsies show adenoma with HGD. Got a CT, no obvious malignancy. I think probably benign and amenable to EMR, but the angle was not good on my exam given location, hard to see full base of  it, may be a tough one. Are you interested? Would you recommend EUS before removal to ensure no CA? How long are you booking out if you are interested? Thanks much.  Richardson Landry

## 2019-04-23 DIAGNOSIS — S86011D Strain of right Achilles tendon, subsequent encounter: Secondary | ICD-10-CM | POA: Diagnosis not present

## 2019-04-27 DIAGNOSIS — S86011D Strain of right Achilles tendon, subsequent encounter: Secondary | ICD-10-CM | POA: Diagnosis not present

## 2019-05-01 DIAGNOSIS — S86011D Strain of right Achilles tendon, subsequent encounter: Secondary | ICD-10-CM | POA: Diagnosis not present

## 2019-05-05 DIAGNOSIS — S86011D Strain of right Achilles tendon, subsequent encounter: Secondary | ICD-10-CM | POA: Diagnosis not present

## 2019-05-08 DIAGNOSIS — S86011D Strain of right Achilles tendon, subsequent encounter: Secondary | ICD-10-CM | POA: Diagnosis not present

## 2019-05-11 DIAGNOSIS — S86011D Strain of right Achilles tendon, subsequent encounter: Secondary | ICD-10-CM | POA: Diagnosis not present

## 2019-05-14 DIAGNOSIS — S86011D Strain of right Achilles tendon, subsequent encounter: Secondary | ICD-10-CM | POA: Diagnosis not present

## 2019-05-19 DIAGNOSIS — S86011D Strain of right Achilles tendon, subsequent encounter: Secondary | ICD-10-CM | POA: Diagnosis not present

## 2019-05-19 DIAGNOSIS — Z0000 Encounter for general adult medical examination without abnormal findings: Secondary | ICD-10-CM | POA: Diagnosis not present

## 2019-05-19 DIAGNOSIS — Z125 Encounter for screening for malignant neoplasm of prostate: Secondary | ICD-10-CM | POA: Diagnosis not present

## 2019-05-19 DIAGNOSIS — R946 Abnormal results of thyroid function studies: Secondary | ICD-10-CM | POA: Diagnosis not present

## 2019-05-22 DIAGNOSIS — S86011D Strain of right Achilles tendon, subsequent encounter: Secondary | ICD-10-CM | POA: Diagnosis not present

## 2019-05-26 DIAGNOSIS — Z0000 Encounter for general adult medical examination without abnormal findings: Secondary | ICD-10-CM | POA: Diagnosis not present

## 2019-05-26 DIAGNOSIS — Z1331 Encounter for screening for depression: Secondary | ICD-10-CM | POA: Diagnosis not present

## 2019-05-26 DIAGNOSIS — Z859 Personal history of malignant neoplasm, unspecified: Secondary | ICD-10-CM | POA: Diagnosis not present

## 2019-05-26 DIAGNOSIS — R946 Abnormal results of thyroid function studies: Secondary | ICD-10-CM | POA: Diagnosis not present

## 2019-05-26 DIAGNOSIS — M7660 Achilles tendinitis, unspecified leg: Secondary | ICD-10-CM | POA: Diagnosis not present

## 2019-05-26 DIAGNOSIS — D126 Benign neoplasm of colon, unspecified: Secondary | ICD-10-CM | POA: Diagnosis not present

## 2019-06-02 ENCOUNTER — Ambulatory Visit: Payer: BC Managed Care – PPO | Admitting: Gastroenterology

## 2019-06-02 ENCOUNTER — Other Ambulatory Visit: Payer: Self-pay

## 2019-06-02 ENCOUNTER — Encounter: Payer: Self-pay | Admitting: Gastroenterology

## 2019-06-02 VITALS — BP 122/70 | Temp 97.4°F | Ht 71.0 in | Wt 201.0 lb

## 2019-06-02 DIAGNOSIS — D128 Benign neoplasm of rectum: Secondary | ICD-10-CM | POA: Diagnosis not present

## 2019-06-02 DIAGNOSIS — Z86010 Personal history of colonic polyps: Secondary | ICD-10-CM

## 2019-06-02 DIAGNOSIS — R933 Abnormal findings on diagnostic imaging of other parts of digestive tract: Secondary | ICD-10-CM

## 2019-06-02 MED ORDER — SUPREP BOWEL PREP KIT 17.5-3.13-1.6 GM/177ML PO SOLN: 1.0000 | ORAL | 0 refills | Status: DC

## 2019-06-02 NOTE — Progress Notes (Signed)
[facility] VISIT   Primary Care Provider Velna Hatchet, [place] [address] [phone]  Referring Provider Dr. Havery Moros  Patient Profile: Rodney Whitney is a 63 y.o. male with a pmh significant for allergies, prior skin cancer, colon polyps (TA), large rectal/rectosigmoid adenoma with HGD.  The patient presents to the [facility] for an evaluation and management of problem(s) noted below:  Problem List 1. Adenomatous rectal polyp   2. Abnormal colonoscopy   3. History of colonic polyps     History of Present Illness The patient recently underwent a surveillance colonoscopy in October of this year.  He was found to have multiple tubular adenomas as well as hemorrhoids and a large tubular adenoma with high-grade dysplasia in the proximal rectum/rectosigmoid region.  The base was not fully evaluated but imaging suggested no overt invasion into the muscularis or deeper lesion on CT scan.  It is for this reason that the patient is referred to consider advanced resection techniques via colonoscopy today.  The patient is accompanied by his wife.  The patient has no significant abdominal issues.  He has no nausea or vomiting.  He has had no complications after his recent colonoscopy.  He denies any changes in his bowel habits or any bleeding.  No family history of colon cancer or colon polyps that he is aware of.  His diet is not significant for continued red meat ingestion.  GI Review of Systems Positive as above Negative for odynophagia, dysphagia, nausea, vomiting, pain, change in bowel habits  Review of Systems General: Denies fevers/chills/weight loss HEENT: Denies oral lesions Cardiovascular: Denies chest pain/palpitations Pulmonary: Denies shortness of breath/cough Gastroenterological: See HPI Genitourinary: Denies darkened urine Hematological: Denies easy bruising/bleeding Dermatological: Denies jaundice  Psychological: Mood is anxious about trying to get this taken care of but overall stable   Medications Current Outpatient Medications  Medication Sig Dispense Refill  . fluticasone (VERAMYST) 27.5 MCG/SPRAY nasal spray Place 2 sprays into the nose daily.    . Multiple Vitamin (MULTIVITAMIN) tablet Take 1 tablet by mouth daily.    . Na Sulfate-K Sulfate-Mg Sulf (SUPREP BOWEL PREP KIT) 17.5-3.13-1.6 GM/177ML SOLN Take 1 kit by mouth as directed. For colonoscopy prep 354 mL 0   Current Facility-Administered Medications  Medication Dose Route Frequency Provider Last Rate Last Dose  . 0.9 %  sodium chloride infusion  500 mL Intravenous Once Armbruster, Carlota Raspberry, MD        Allergies No Known Allergies  Histories Past Medical History:  Diagnosis Date  . Allergy   . Seasonal allergies   . Skin cancer    right ear   Past Surgical History:  Procedure Laterality Date  . bone spur Left    foot  . torn meniscus Right    knee  . VASECTOMY     Social History   Socioeconomic History  . Marital status: Married    Spouse name: Not on file  . Number of children: Not on file  . Years of education: Not on file  . Highest education level: Not on file  Occupational History  . Not on file  Social Needs  . Financial resource strain: Not on file  . Food insecurity    Worry: Not on file    Inability: Not on file  . Transportation needs    Medical: Not on file    Non-medical: Not on file  Tobacco Use  . Smoking status: Never Smoker  . Smokeless tobacco: Never  Used  Substance and Sexual Activity  . Alcohol use: Yes    Comment: occas  . Drug use: Never  . Sexual activity: Not on file  Lifestyle  . Physical activity    Days per week: Not on file    Minutes per session: Not on file  . Stress: Not on file  Relationships  . Social Herbalist on phone: Not on file    Gets together: Not on file    Attends religious service: Not on file    Active member of club or  organization: Not on file    Attends meetings of clubs or organizations: Not on file    Relationship status: Not on file  . Intimate partner violence    Fear of current or ex partner: Not on file    Emotionally abused: Not on file    Physically abused: Not on file    Forced sexual activity: Not on file  Other Topics Concern  . Not on file  Social History Narrative  . Not on file   Family History  Problem Relation Age of Onset  . Colon cancer Neg Hx   . Esophageal cancer Neg Hx   . Rectal cancer Neg Hx   . Stomach cancer Neg Hx   . Inflammatory bowel disease Neg Hx   . Liver cancer Neg Hx   . Pancreatic cancer Neg Hx    I have reviewed his medical, social, and family history in detail and updated the electronic medical record as necessary.    PHYSICAL EXAMINATION  BP 122/70   Temp (!) 97.4 F (36.3 C)   Ht '5\' 11"'  (1.803 m)   Wt 201 lb (91.2 kg)   BMI 28.03 kg/m  Wt Readings from Last 3 Encounters:  06/02/19 201 lb (91.2 kg)  04/06/19 201 lb (91.2 kg)  03/23/19 201 lb (91.2 kg)  GEN: NAD, appears stated age, doesn't appear chronically ill, accompanied by wife PSYCH: Cooperative, without pressured speech EYE: Conjunctivae pink, sclerae anicteric ENT: MMM, without oral ulcers NECK: Supple CV: RR without R/Gs  RESP: CTAB posteriorly, without wheezing GI: NABS, soft, NT/ND, without rebound or guarding, no HSM appreciated MSK/EXT: No lower extremity edema SKIN: No jaundice NEURO:  Alert & Oriented x 3, no focal deficits   REVIEW OF DATA  I reviewed the following data at the time of this encounter:  GI Procedures and Studies  October 2020 colonoscopy - Two 3 to 5 mm polyps in the transverse colon, removed with a cold snare. Resected and retrieved. - One 6 mm polyp in the sigmoid colon, removed with a cold snare. Resected and retrieved. - One 6 mm polyp at the recto-sigmoid colon, removed with a cold snare. Resected and retrieved. - One large polyp in the rectum at  the recto-sigmoid colon as described above. I suspect this is more than likely a benign adenomatous polyp however biopsied to ensure no malignancy. It was not removed today, would prefer to remove at the hospital if amenable to endoscopic resection. Tattooed. - Diverticulosis in the entire examined colon. - Internal hemorrhoids. - The examination was otherwise normal. Pathology Diagnosis 1. Surgical [P], colon, transverse, sigmoid, and rectosigmoid, polyp (3) - TUBULAR ADENOMA WITH HIGH GRADE DYSPLASIA (X MULTIPLE FRAGMENTS). 2. Surgical [P], colon, rectosigmoid polyp - TUBULAR ADENOMA WITH HIGH GRADE DYSPLASIA.  Laboratory Studies  Reviewed those in epic  Imaging Studies  October 2020 CT abdomen pelvis with contrast IMPRESSION: 1. No acute findings within the abdomen  or pelvis. No findings to suggest metastatic disease. 2. There is a small soft tissue attenuating structure along the right lateral wall of the rectum measuring 2.1 cm and 7 cm from the verge. This is nonspecific in the setting of an unprepped colon but may represent the large polyp identified on colonoscopy.   ASSESSMENT  Mr. Hallam is a 63 y.o. male with a pmh significant for allergies, prior skin cancer, colon polyps (TA), large rectal/rectosigmoid adenoma with HGD.  The patient is seen today for evaluation and management of:  1. Adenomatous rectal polyp   2. Abnormal colonoscopy   3. History of colonic polyps    The patient is clinically and hemodynamically stable.  Based upon the description and endoscopic pictures I do feel that it is reasonable to pursue an Advanced Polypectomy attempt of the polyp/lesion.  We discussed some of the techniques of advanced polypectomy which include Endoscopic Mucosal Resection, OVESCO Full-Thickness Resection, Endorotor Morcellation, and Tissue Ablation via Fulguration.  The risks and benefits of endoscopic evaluation were discussed with the patient; these include but are not  limited to the risk of perforation, infection, bleeding, missed lesions, lack of diagnosis, severe illness requiring hospitalization, as well as anesthesia and sedation related illnesses.  During attempts at advanced polypectomy, the risks of bleeding and perforation/leak are increased as opposed to diagnostic and screening procedures, and that was discussed with the patient as well.   In addition, I explained that with the possible need for piecemeal resection, subsequent short-interval endoscopic evaluation for follow up and potential retreatment of the lesion/area may be necessary.  I did offer, a referral to surgery in order for patient to have opportunity to discuss surgical management/intervention prior to finalizing decision for attempt at endoscopic removal, however, the patient deferred on this.  If, after attempt at removal of the polyp/lesion, it is found that the patient has a complication or that an invasive lesion or malignant lesion is found, or that the polyp/lesion continues to recur, the patient is aware and understands that surgery may still be indicated/required.  We briefly discussed that our surgeons at times can do transanal excisions but where this is described may be out of reach in the rectosigmoid/proximal rectum region for a TAMIS or TAE.  All patient questions were answered, to the best of my ability, and the patient and wife agree to the aforementioned plan of action with follow-up as indicated and attempt an endoscopic approach first.   PLAN  Laboratories as outlined below Proceed with scheduling Flex Sigmoidoscopy with EMR attempt and possible EUS   Orders Placed This Encounter  Procedures  . CBC  . Basic Metabolic Panel (BMET)  . INR/PT  . Ambulatory referral to Gastroenterology    New Prescriptions   NA SULFATE-K SULFATE-MG SULF (SUPREP BOWEL PREP KIT) 17.5-3.13-1.6 GM/177ML SOLN    Take 1 kit by mouth as directed. For colonoscopy prep   Modified Medications   No  medications on file    Planned Follow Up No follow-ups on file.   Justice Britain, MD [facility] Advanced Endoscopy Office # [phone]

## 2019-06-02 NOTE — Patient Instructions (Signed)
If you are age 65 or older, your body mass index should be between 23-30. Your Body mass index is 28.03 kg/m. If this is out of the aforementioned range listed, please consider follow up with your Primary Care Provider.  If you are age 78 or younger, your body mass index should be between 19-25. Your Body mass index is 28.03 kg/m. If this is out of the aformentioned range listed, please consider follow up with your Primary Care Provider.   You have been scheduled for a flexible sigmoidoscopy , EUS and EMR. Please follow the written instructions given to you at your visit today. If you use inhalers (even only as needed), please bring them with you on the day of your procedure.  Your provider has requested that you go to the basement level for lab work before leaving today. Press "B" on the elevator. The lab is located at the first door on the left as you exit the elevator.   Thank you for choosing me and [facility].  Dr. Rush Landmark

## 2019-06-03 DIAGNOSIS — Z86010 Personal history of colonic polyps: Secondary | ICD-10-CM | POA: Insufficient documentation

## 2019-06-03 DIAGNOSIS — D128 Benign neoplasm of rectum: Secondary | ICD-10-CM | POA: Insufficient documentation

## 2019-06-03 DIAGNOSIS — R933 Abnormal findings on diagnostic imaging of other parts of digestive tract: Secondary | ICD-10-CM | POA: Insufficient documentation

## 2019-06-03 NOTE — Addendum Note (Signed)
Addended by: Debbe Mounts on: 06/03/2019 11:54 AM   Modules accepted: Orders, SmartSet

## 2019-07-01 ENCOUNTER — Other Ambulatory Visit (INDEPENDENT_AMBULATORY_CARE_PROVIDER_SITE_OTHER): Payer: BC Managed Care – PPO

## 2019-07-01 DIAGNOSIS — D128 Benign neoplasm of rectum: Secondary | ICD-10-CM | POA: Diagnosis not present

## 2019-07-01 LAB — CBC
HCT: 42.3 % (ref 39.0–52.0)
Hemoglobin: 14.7 g/dL (ref 13.0–17.0)
MCHC: 34.8 g/dL (ref 30.0–36.0)
MCV: 98.8 fl (ref 78.0–100.0)
Platelets: 176 10*3/uL (ref 150.0–400.0)
RBC: 4.28 Mil/uL (ref 4.22–5.81)
RDW: 11.9 % (ref 11.5–15.5)
WBC: 4.9 10*3/uL (ref 4.0–10.5)

## 2019-07-01 LAB — PROTIME-INR
INR: 1 ratio (ref 0.8–1.0)
Prothrombin Time: 11.5 s (ref 9.6–13.1)

## 2019-07-01 LAB — BASIC METABOLIC PANEL
BUN: 15 mg/dL (ref 6–23)
CO2: 23 mEq/L (ref 19–32)
Calcium: 9 mg/dL (ref 8.4–10.5)
Chloride: 105 mEq/L (ref 96–112)
Creatinine, Ser: 0.86 mg/dL (ref 0.40–1.50)
GFR: 89.47 mL/min (ref >60.00)
Glucose, Bld: 127 mg/dL — ABNORMAL HIGH (ref 70–99)
Potassium: 3.9 mEq/L (ref 3.5–5.1)
Sodium: 137 mEq/L (ref 135–145)

## 2019-07-06 ENCOUNTER — Other Ambulatory Visit (HOSPITAL_COMMUNITY): Payer: BC Managed Care – PPO

## 2019-07-06 ENCOUNTER — Other Ambulatory Visit (HOSPITAL_COMMUNITY)
Admission: RE | Admit: 2019-07-06 | Discharge: 2019-07-06 | Disposition: A | Discharge status: Home / Self Care | Payer: BC Managed Care – PPO | Source: Ambulatory Visit | Attending: Gastroenterology | Admitting: Gastroenterology

## 2019-07-06 DIAGNOSIS — Z20822 Contact with and (suspected) exposure to covid-19: Secondary | ICD-10-CM | POA: Diagnosis not present

## 2019-07-06 DIAGNOSIS — Z01812 Encounter for preprocedural laboratory examination: Secondary | ICD-10-CM | POA: Diagnosis not present

## 2019-07-07 ENCOUNTER — Other Ambulatory Visit: Payer: Self-pay

## 2019-07-07 ENCOUNTER — Encounter (HOSPITAL_COMMUNITY): Payer: Self-pay | Admitting: Gastroenterology

## 2019-07-07 LAB — NOVEL CORONAVIRUS, NAA (HOSP ORDER, SEND-OUT TO REF LAB; TAT 18-24 HRS): SARS-CoV-2, NAA: NOT DETECTED

## 2019-07-09 ENCOUNTER — Encounter (HOSPITAL_COMMUNITY): Payer: Self-pay | Admitting: Gastroenterology

## 2019-07-09 ENCOUNTER — Ambulatory Visit (HOSPITAL_COMMUNITY): Payer: BC Managed Care – PPO | Admitting: Anesthesiology

## 2019-07-09 ENCOUNTER — Ambulatory Visit (HOSPITAL_COMMUNITY)
Admission: RE | Admit: 2019-07-09 | Discharge: 2019-07-09 | Disposition: A | Discharge status: Home / Self Care | Payer: BC Managed Care – PPO | Attending: Gastroenterology | Admitting: Gastroenterology

## 2019-07-09 ENCOUNTER — Encounter (HOSPITAL_COMMUNITY)
Admission: RE | Disposition: A | Discharge status: Home / Self Care | Payer: Self-pay | Source: Home / Self Care | Attending: Gastroenterology

## 2019-07-09 ENCOUNTER — Other Ambulatory Visit: Payer: Self-pay

## 2019-07-09 DIAGNOSIS — D128 Benign neoplasm of rectum: Secondary | ICD-10-CM | POA: Diagnosis not present

## 2019-07-09 DIAGNOSIS — K641 Second degree hemorrhoids: Secondary | ICD-10-CM | POA: Insufficient documentation

## 2019-07-09 DIAGNOSIS — K635 Polyp of colon: Secondary | ICD-10-CM

## 2019-07-09 DIAGNOSIS — Z86010 Personal history of colonic polyps: Secondary | ICD-10-CM | POA: Insufficient documentation

## 2019-07-09 DIAGNOSIS — K5730 Diverticulosis of large intestine without perforation or abscess without bleeding: Secondary | ICD-10-CM | POA: Insufficient documentation

## 2019-07-09 DIAGNOSIS — D125 Benign neoplasm of sigmoid colon: Secondary | ICD-10-CM | POA: Diagnosis not present

## 2019-07-09 DIAGNOSIS — Z85828 Personal history of other malignant neoplasm of skin: Secondary | ICD-10-CM | POA: Insufficient documentation

## 2019-07-09 DIAGNOSIS — R933 Abnormal findings on diagnostic imaging of other parts of digestive tract: Secondary | ICD-10-CM

## 2019-07-09 DIAGNOSIS — K621 Rectal polyp: Secondary | ICD-10-CM | POA: Diagnosis not present

## 2019-07-09 HISTORY — PX: HEMOSTASIS CLIP PLACEMENT: SHX6857

## 2019-07-09 HISTORY — PX: ENDOSCOPIC MUCOSAL RESECTION: SHX6839

## 2019-07-09 HISTORY — PX: POLYPECTOMY: SHX5525

## 2019-07-09 HISTORY — PX: SUBMUCOSAL LIFTING INJECTION: SHX6855

## 2019-07-09 HISTORY — PX: FLEXIBLE SIGMOIDOSCOPY: SHX5431

## 2019-07-09 SURGERY — SIGMOIDOSCOPY, FLEXIBLE
Anesthesia: Monitor Anesthesia Care

## 2019-07-09 MED ORDER — LACTATED RINGERS IV SOLN: INTRAVENOUS | Status: DC

## 2019-07-09 MED ORDER — LABETALOL HCL 5 MG/ML IV SOLN
INTRAVENOUS | Status: AC
  Filled 2019-07-09: qty 4

## 2019-07-09 MED ORDER — PROPOFOL 10 MG/ML IV BOLUS
INTRAVENOUS | Status: DC | PRN
  Administered 2019-07-09: 20 mg via INTRAVENOUS
  Administered 2019-07-09: 30 mg via INTRAVENOUS

## 2019-07-09 MED ORDER — SODIUM CHLORIDE 0.9 % IV SOLN: INTRAVENOUS | Status: DC

## 2019-07-09 MED ORDER — ONDANSETRON HCL 4 MG/2ML IJ SOLN
INTRAMUSCULAR | Status: DC | PRN
  Administered 2019-07-09: 4 mg via INTRAVENOUS

## 2019-07-09 MED ORDER — EPHEDRINE SULFATE-NACL 50-0.9 MG/10ML-% IV SOSY
PREFILLED_SYRINGE | INTRAVENOUS | Status: DC | PRN
  Administered 2019-07-09 (×2): 10 mg via INTRAVENOUS

## 2019-07-09 MED ORDER — MIDAZOLAM HCL 5 MG/5ML IJ SOLN
INTRAMUSCULAR | Status: DC | PRN
  Administered 2019-07-09: 2 mg via INTRAVENOUS

## 2019-07-09 MED ORDER — PROPOFOL 500 MG/50ML IV EMUL
INTRAVENOUS | Status: DC | PRN
  Administered 2019-07-09: 125 ug/kg/min via INTRAVENOUS

## 2019-07-09 NOTE — Op Note (Addendum)
[facility] Patient Name: Rodney Whitney Procedure Date : 07/09/2019 MRN: [id number] Attending MD: Justice Britain , MD Date of Birth: March 16, 1956 CSN: [id number] Age: 64 Admit Type: Outpatient Procedure:                Flexible Sigmoidoscopy Indications:              Personal history of colonic polyps, Rectal Adenoma                            with HGD for Advanced Polypectomy attempt Providers:                Justice Britain, MD, Burtis Junes, RN, Lazaro Arms,                            Technician Referring MD:             Carlota Raspberry. Havery Moros, MD, Velna Hatchet Medicines:                Monitored Anesthesia Care Complications:            No immediate complications. Estimated Blood Loss:     Estimated blood loss was minimal. Procedure:                Pre-Anesthesia Assessment:                           - Prior to the procedure, a History and Physical                            was performed, and patient medications and                            allergies were reviewed. The patient's tolerance of                            previous anesthesia was also reviewed. The risks                            and benefits of the procedure and the sedation                            options and risks were discussed with the patient.                            All questions were answered, and informed consent                            was obtained. Prior Anticoagulants: The patient has                            taken no previous anticoagulant or antiplatelet                            agents. ASA Grade Assessment: II - A patient with  mild systemic disease. After reviewing the risks                            and benefits, the patient was deemed in                            satisfactory condition to undergo the procedure.                           After obtaining informed consent, the scope was                            passed under direct vision. The  [id number]                            ([id number]) Olympus therapeutic gastroscope was                            introduced through the anus and advanced to the the                            left transverse colon. The flexible sigmoidoscopy                            was accomplished without difficulty. The patient                            tolerated the procedure. The quality of the bowel                            preparation was good. Scope In: 7:56:00 AM Scope Out: 8:34:44 AM Total Procedure Duration: 0 hours 38 minutes 44 seconds  Findings:      The digital rectal exam findings include hemorrhoids. Pertinent       negatives include no palpable rectal lesions.      A 3 mm polyp was found in the sigmoid colon. The polyp was sessile. The       polyp was removed with a cold snare. Resection and retrieval were       complete.      A tattoo was seen in the proximal rectum.      Across from the tattoo a 28 mm polyp was found in the proximal rectum.       The polyp was semi-sessile. Preparations were made for mucosal       resection. Orise gel was injected to raise the lesion. Piecemeal mucosal       resection using a snare was performed. Resection and retrieval were       complete. Fulguration to ablate the resection margin by snare tip soft       coagulation was successful. To prevent bleeding after mucosal resection,       five hemostatic clips were successfully placed (MR conditional). There       was no bleeding during, or at the end, of the procedure.      A few small-mouthed diverticula were found in the sigmoid colon and       descending colon.  Non-bleeding non-thrombosed internal hemorrhoids were found during       retroflexion, during perianal exam and during digital exam. The       hemorrhoids were Grade II (internal hemorrhoids that prolapse but reduce       spontaneously). Impression:               - Hemorrhoids found on digital rectal exam.                           -  One 3 mm polyp in the sigmoid colon, removed with                            a cold snare. Resected and retrieved.                           - One 28 mm polyp in the proximal rectum, removed                            with mucosal resection. Resected and retrieved.                            Treated with a hot snare tip coagulation to                            resection margin. Clips (MR conditional) were                            placed.                           - Diverticulosis in the sigmoid colon and in the                            descending colon.                           - Non-bleeding non-thrombosed internal hemorrhoids. Recommendation:           - The patient will be observed post-procedure,                            until all discharge criteria are met.                           - Discharge patient to home.                           - Patient has a contact number available for                            emergencies. The signs and symptoms of potential                            delayed complications were discussed with the  patient. Return to normal activities tomorrow.                            Written discharge instructions were provided to the                            patient.                           - Resume previous diet.                           - Monitor for signs/symptoms of bleeding,                            perforation, and infection. If issues please call                            our number to get further assistance as needed.                           - Await pathology results.                           - Repeat flexible sigmoidoscopy in 6 months for                            surveillance.                           - The findings and recommendations were discussed                            with the patient.                           - The findings and recommendations were discussed                            with the  patient's family. Procedure Code(s):        --- Professional ---                           [phone], Sigmoidoscopy, flexible; with endoscopic                            mucosal resection                           [id number], 59, Sigmoidoscopy, flexible; with removal of                            tumor(s), polyp(s), or other lesion(s) by snare                            technique Diagnosis Code(s):        --- Professional ---  K64.1, Second degree hemorrhoids                           K63.5, Polyp of colon                           K62.1, Rectal polyp                           Z86.010, Personal history of colonic polyps                           K57.30, Diverticulosis of large intestine without                            perforation or abscess without bleeding CPT copyright 2019 American Medical Association. All rights reserved. The codes documented in this report are preliminary and upon coder review may  be revised to meet current compliance requirements. Justice Britain, MD 07/09/2019 8:51:50 AM Number of Addenda: 0

## 2019-07-09 NOTE — Anesthesia Procedure Notes (Signed)
Procedure Name: MAC Date/Time: 07/09/2019 8:02 AM Performed by: Lieutenant Diego, CRNA Pre-anesthesia Checklist: Patient identified, Emergency Drugs available, Suction available, Patient being monitored and Timeout performed Patient Re-evaluated:Patient Re-evaluated prior to induction Oxygen Delivery Method: Simple face mask

## 2019-07-09 NOTE — Anesthesia Postprocedure Evaluation (Signed)
Anesthesia Post Note  Patient: Joan Flores  Procedure(s) Performed: FLEXIBLE SIGMOIDOSCOPY (N/A ) ENDOSCOPIC MUCOSAL RESECTION (N/A ) LOWER ENDOSCOPIC ULTRASOUND (EUS) (N/A ) POLYPECTOMY [place] PLACEMENT     Patient location during evaluation: Endoscopy Anesthesia Type: MAC Level of consciousness: awake and alert Pain management: pain level controlled Vital Signs Assessment: post-procedure vital signs reviewed and stable Respiratory status: spontaneous breathing, nonlabored ventilation and respiratory function stable Cardiovascular status: stable and blood pressure returned to baseline Postop Assessment: no apparent nausea or vomiting Anesthetic complications: no    Last Vitals:  Vitals:   07/09/19 0900 07/09/19 0905  BP: (!) 144/91 (!) 144/88  Pulse: 84 84  Resp: 15 18  Temp:  (!) 36.4 C  SpO2: 95% 95%    Last Pain:  Vitals:   07/09/19 0905  TempSrc:   PainSc: 0-No pain                 Lynda Rainwater

## 2019-07-09 NOTE — Transfer of Care (Signed)
Immediate Anesthesia Transfer of Care Note  Patient: Rodney Whitney  Procedure(s) Performed: FLEXIBLE SIGMOIDOSCOPY (N/A ) ENDOSCOPIC MUCOSAL RESECTION (N/A ) LOWER ENDOSCOPIC ULTRASOUND (EUS) (N/A ) POLYPECTOMY SUBMUCOSAL LIFTING INJECTION HEMOSTASIS CLIP PLACEMENT  Patient Location: PACU  Anesthesia Type:MAC  Level of Consciousness: awake and alert   Airway & Oxygen Therapy: Patient Spontanous Breathing  Post-op Assessment: Report given to RN and Post -op Vital signs reviewed and stable  Post vital signs: Reviewed and stable  Last Vitals:  Vitals Value Taken Time  BP 129/86 07/09/19 0843  Temp    Pulse 86 07/09/19 0843  Resp 19 07/09/19 0843  SpO2 96 % 07/09/19 0843  Vitals shown include unvalidated device data.  Last Pain:  Vitals:   07/09/19 0658  TempSrc: Oral  PainSc: 0-No pain         Complications: No apparent anesthesia complications

## 2019-07-09 NOTE — Anesthesia Preprocedure Evaluation (Signed)
Anesthesia Evaluation  Patient identified by MRN, date of birth, ID band Patient awake    Reviewed: Allergy & Precautions, NPO status , Patient's Chart, lab work & pertinent test results  Airway Mallampati: II  TM Distance: >3 FB Neck ROM: Full    Dental no notable dental hx.    Pulmonary neg pulmonary ROS,    Pulmonary exam normal breath sounds clear to auscultation       Cardiovascular negative cardio ROS Normal cardiovascular exam Rhythm:Regular Rate:Normal     Neuro/Psych negative neurological ROS  negative psych ROS   GI/Hepatic negative GI ROS, Neg liver ROS,   Endo/Other  negative endocrine ROS  Renal/GU negative Renal ROS  negative genitourinary   Musculoskeletal negative musculoskeletal ROS (+)   Abdominal   Peds negative pediatric ROS (+)  Hematology negative hematology ROS (+)   Anesthesia Other Findings   Reproductive/Obstetrics negative OB ROS                             Anesthesia Physical Anesthesia Plan  ASA: II  Anesthesia Plan: MAC   Post-op Pain Management:    Induction: Intravenous  PONV Risk Score and Plan: 1 and Ondansetron  Airway Management Planned: Simple Face Mask  Additional Equipment:   Intra-op Plan:   Post-operative Plan:   Informed Consent: I have reviewed the patients History and Physical, chart, labs and discussed the procedure including the risks, benefits and alternatives for the proposed anesthesia with the patient or authorized representative who has indicated his/her understanding and acceptance.     Dental advisory given  Plan Discussed with: CRNA  Anesthesia Plan Comments:         Anesthesia Quick Evaluation

## 2019-07-09 NOTE — H&P (Signed)
GASTROENTEROLOGY PROCEDURE H&P NOTE   Primary Care Physician: Velna Hatchet, MD  HPI: Rodney Whitney is a 64 y.o. male who presents for Colonoscopy with EMR attempt and possible EUS.  Past Medical History:  Diagnosis Date  . Allergy   . Seasonal allergies   . Skin cancer    right ear   Past Surgical History:  Procedure Laterality Date  . bone spur Left    foot  . torn meniscus Right    knee  . VASECTOMY     Current Facility-Administered Medications  Medication Dose Route Frequency Provider Last Rate Last Admin  . lactated ringers infusion   Intravenous Continuous Mansouraty, Telford Nab., MD 20 mL/hr at 07/09/19 0709 New Bag at 07/09/19 0709   No Known Allergies Family History  Problem Relation Age of Onset  . Colon cancer Neg Hx   . Esophageal cancer Neg Hx   . Rectal cancer Neg Hx   . Stomach cancer Neg Hx   . Inflammatory bowel disease Neg Hx   . Liver cancer Neg Hx   . Pancreatic cancer Neg Hx    Social History   Socioeconomic History  . Marital status: Married    Spouse name: Not on file  . Number of children: Not on file  . Years of education: Not on file  . Highest education level: Not on file  Occupational History  . Not on file  Tobacco Use  . Smoking status: Never Smoker  . Smokeless tobacco: Never Used  Substance and Sexual Activity  . Alcohol use: Yes    Alcohol/week: 12.0 standard drinks    Types: 12 Cans of beer per week    Comment: occas  . Drug use: Never  . Sexual activity: Not on file  Other Topics Concern  . Not on file  Social History Narrative  . Not on file   Social Determinants of Health   Financial Resource Strain:   . Difficulty of Paying Living Expenses: Not on file  Food Insecurity:   . Worried About Charity fundraiser in the Last Year: Not on file  . Ran Out of Food in the Last Year: Not on file  Transportation Needs:   . Lack of Transportation (Medical): Not on file  . Lack of Transportation (Non-Medical): Not on  file  Physical Activity:   . Days of Exercise per Week: Not on file  . Minutes of Exercise per Session: Not on file  Stress:   . Feeling of Stress : Not on file  Social Connections:   . Frequency of Communication with Friends and Family: Not on file  . Frequency of Social Gatherings with Friends and Family: Not on file  . Attends Religious Services: Not on file  . Active Member of Clubs or Organizations: Not on file  . Attends Archivist Meetings: Not on file  . Marital Status: Not on file  Intimate Partner Violence:   . Fear of Current or Ex-Partner: Not on file  . Emotionally Abused: Not on file  . Physically Abused: Not on file  . Sexually Abused: Not on file    Physical Exam: Vital signs in last 24 hours: Temp:  [98.1 F (36.7 C)] 98.1 F (36.7 C) (01/07 0658) Pulse Rate:  [76] 76 (01/07 0658) Resp:  [11] 11 (01/07 0658) BP: (162)/(91) 162/91 (01/07 0658) SpO2:  [99 %] 99 % (01/07 0658)   GEN: NAD EYE: Sclerae anicteric ENT: MMM CV: Non-tachycardic GI: Soft, NT/ND NEURO:  Alert &  Oriented x 3  Lab Results: No results for input(s): WBC, HGB, HCT, PLT in the last 72 hours. BMET No results for input(s): NA, K, CL, CO2, GLUCOSE, BUN, CREATININE, CALCIUM in the last 72 hours. LFT No results for input(s): PROT, ALBUMIN, AST, ALT, ALKPHOS, BILITOT, BILIDIR, IBILI in the last 72 hours. PT/INR No results for input(s): LABPROT, INR in the last 72 hours.   Impression / Plan: This is a 64 y.o.male who presents for Colonoscopy with EMR attempt and possible EUS.  The risks and benefits of endoscopic evaluation were discussed with the patient; these include but are not limited to the risk of perforation, infection, bleeding, missed lesions, lack of diagnosis, severe illness requiring hospitalization, as well as anesthesia and sedation related illnesses.  The patient is agreeable to proceed.    Justice Britain, MD [facility]  Office # [phone]

## 2019-07-09 NOTE — Discharge Instructions (Signed)
Please minimize Nonsteroidal use for the next 1-2 weeks to decrease risk of bleeding.  YOU HAD AN ENDOSCOPIC PROCEDURE TODAY: Refer to the procedure report and other information in the discharge instructions given to you for any specific questions about what was found during the examination. If this information does not answer your questions, please call [place] office at [phone] to clarify.   YOU SHOULD EXPECT: Some feelings of bloating in the abdomen. Passage of more gas than usual. Walking can help get rid of the air that was put into your GI tract during the procedure and reduce the bloating. If you had a lower endoscopy (such as a colonoscopy or flexible sigmoidoscopy) you may notice spotting of blood in your stool or on the toilet paper. Some abdominal soreness may be present for a day or two, also.  DIET: Your first meal following the procedure should be a light meal and then it is ok to progress to your normal diet. A half-sandwich or bowl of soup is an example of a good first meal. Heavy or fried foods are harder to digest and may make you feel nauseous or bloated. Drink plenty of fluids but you should avoid alcoholic beverages for 24 hours.    ACTIVITY: Your care partner should take you home directly after the procedure. You should plan to take it easy, moving slowly for the rest of the day. You can resume normal activity the day after the procedure however YOU SHOULD NOT DRIVE, use power tools, machinery or perform tasks that involve climbing or major physical exertion for 24 hours (because of the sedation medicines used during the test).   SYMPTOMS TO REPORT IMMEDIATELY: A gastroenterologist can be reached at any hour. Please call [phone]  for any of the following symptoms:  Following lower endoscopy (colonoscopy, flexible sigmoidoscopy) Excessive amounts of blood in the stool  Significant tenderness, worsening of abdominal pains  Swelling of the abdomen that is new, acute    Fever of 100 or higher    FOLLOW UP:  If any biopsies were taken you will be contacted by phone or by letter within the next 1-3 weeks. Call [phone]  if you have not heard about the biopsies in 3 weeks.  Please also call with any specific questions about appointments or follow up tests.

## 2019-07-10 ENCOUNTER — Encounter: Payer: Self-pay | Admitting: Gastroenterology

## 2019-07-10 LAB — SURGICAL PATHOLOGY

## 2019-07-14 DIAGNOSIS — H01002 Unspecified blepharitis right lower eyelid: Secondary | ICD-10-CM | POA: Diagnosis not present

## 2019-09-01 DIAGNOSIS — L565 Disseminated superficial actinic porokeratosis (DSAP): Secondary | ICD-10-CM | POA: Diagnosis not present

## 2019-09-01 DIAGNOSIS — Z85828 Personal history of other malignant neoplasm of skin: Secondary | ICD-10-CM | POA: Diagnosis not present

## 2019-09-01 DIAGNOSIS — L821 Other seborrheic keratosis: Secondary | ICD-10-CM | POA: Diagnosis not present

## 2019-09-01 DIAGNOSIS — L570 Actinic keratosis: Secondary | ICD-10-CM | POA: Diagnosis not present

## 2019-09-29 DIAGNOSIS — H0012 Chalazion right lower eyelid: Secondary | ICD-10-CM | POA: Diagnosis not present

## 2019-11-07 IMAGING — CT CT ABD-PELV W/ CM
3 of 5 series · 16 of 46 positions shown, 18 images · IV contrast (OMNIPAQUE)
Comparison: Lumbar spine MRI [DATE].

CLINICAL DATA: Large rectosigmoid polyp.  Evaluate for malignancy.

EXAM:
CT ABDOMEN AND PELVIS WITH CONTRAST
TECHNIQUE: Multidetector CT imaging of the abdomen and pelvis was performed
using the standard protocol following bolus administration of
intravenous contrast.
CONTRAST:  100mL OMNIPAQUE IOHEXOL 300 MG/ML  SOLN

[Series 2: axial st · axial · 0.85mm/px · z∈[-398,-18]mm · 11 of 92 slices shown, 13 images]
[im 8/92  soft-tissue]
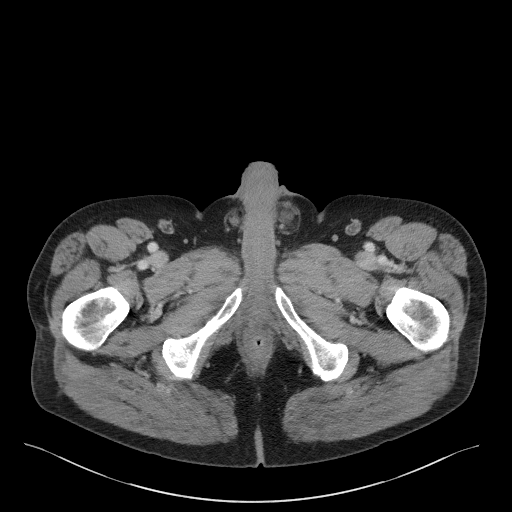
[im 8/92  bone]
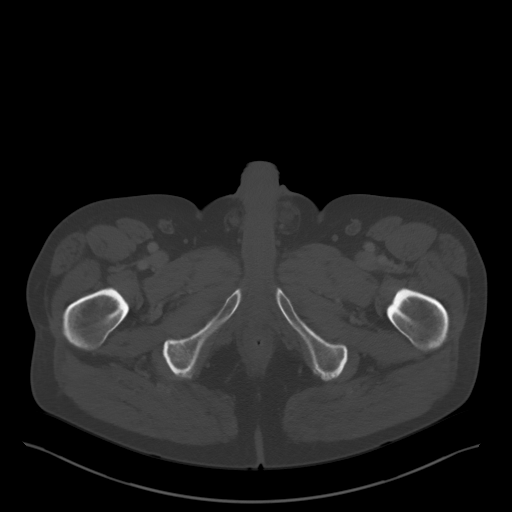
[im 16/92  soft-tissue]
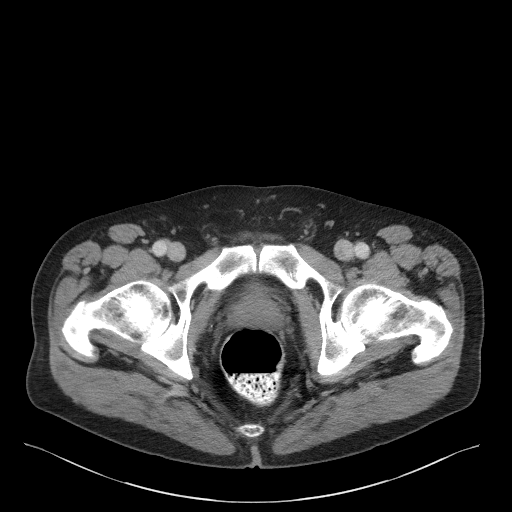
[im 23/92  soft-tissue]
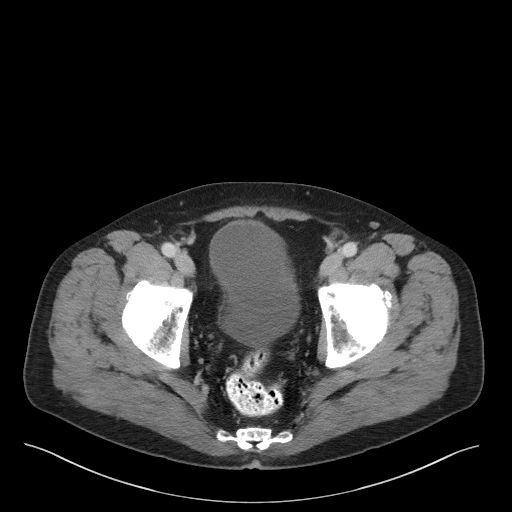
[im 31/92  soft-tissue]
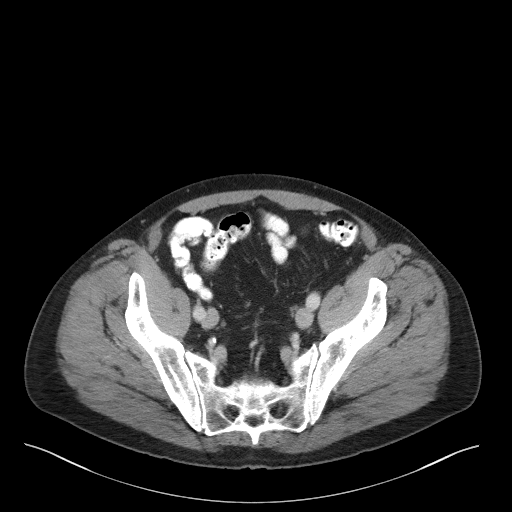
[im 38/92  soft-tissue]
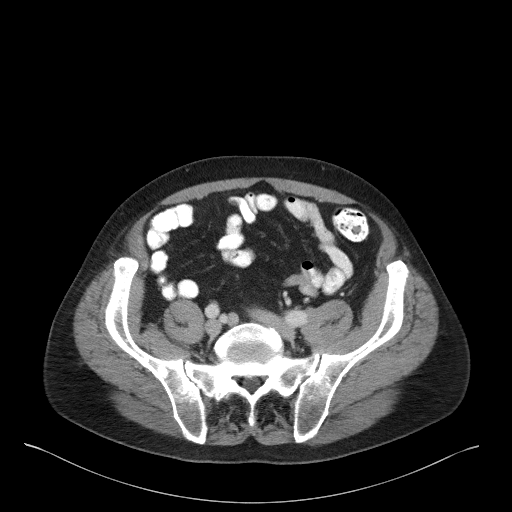
[im 46/92  soft-tissue]
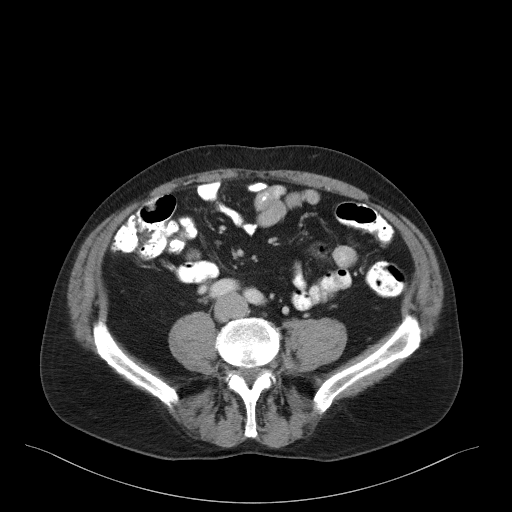
[im 54/92  soft-tissue]
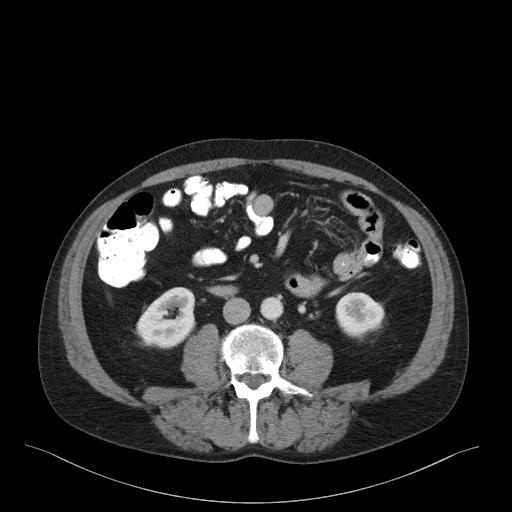
[im 61/92  soft-tissue]
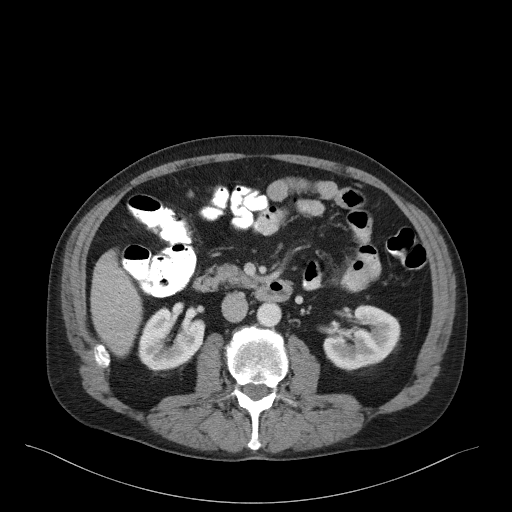
[im 69/92  soft-tissue]
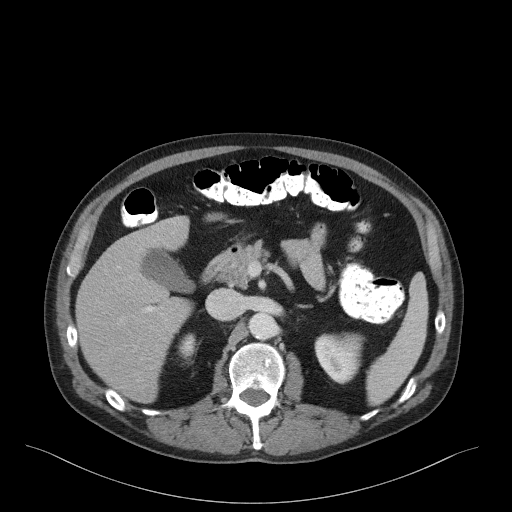
[im 69/92  bone]
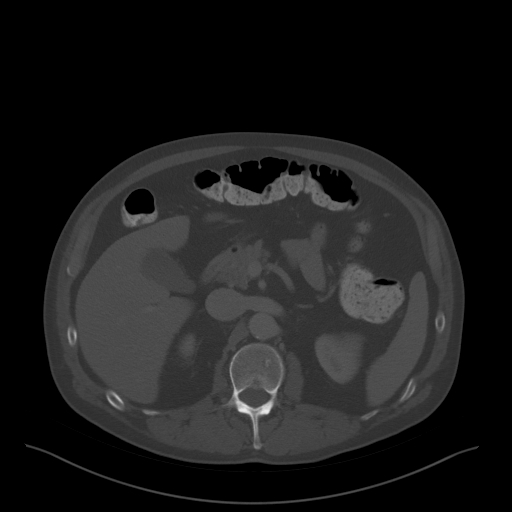
[im 76/92  soft-tissue]
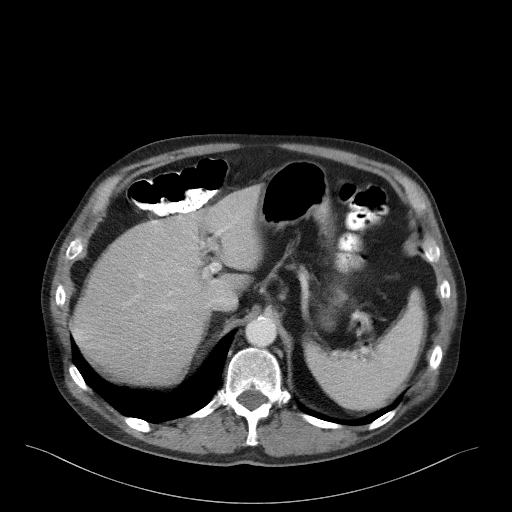
[im 84/92  soft-tissue]
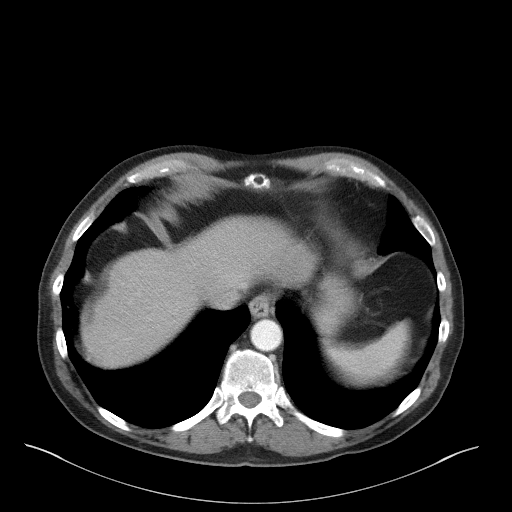

[Series 4: coronal st · coronal · 0.87mm/px · 3 of 86 slices shown]
[im 29/86  soft-tissue]
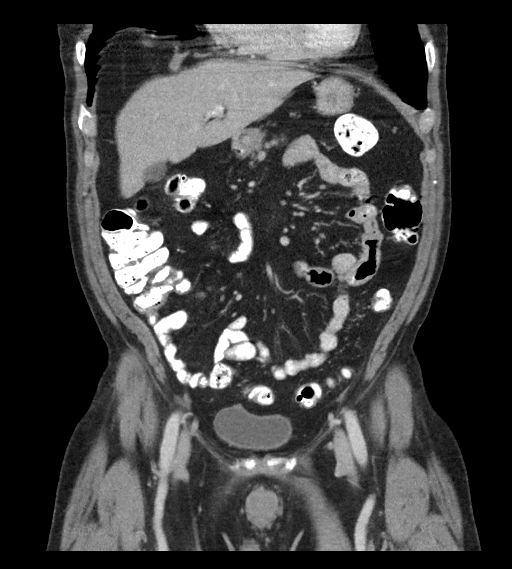
[im 38/86  soft-tissue]
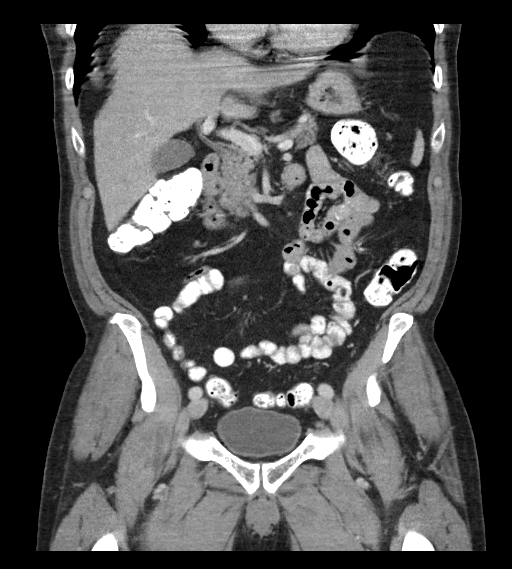
[im 48/86  soft-tissue]
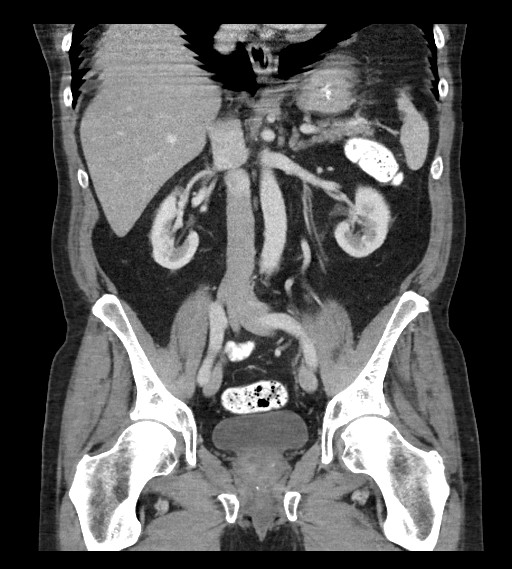

[Series 7: delay · axial · delayed · 0.85mm/px · z∈[-152,-112]mm · 2 of 46 slices shown]
[im 8/46  soft-tissue]
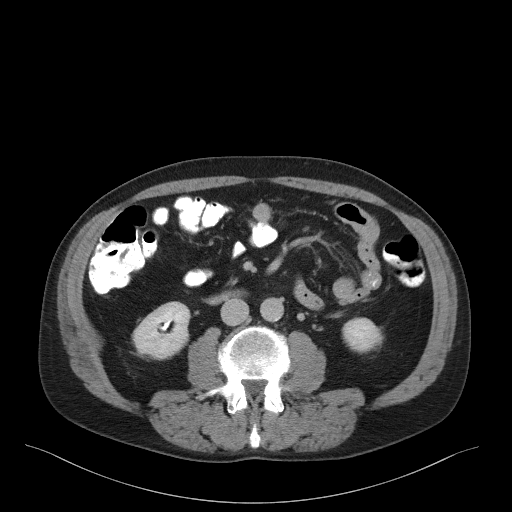
[im 16/46  soft-tissue]
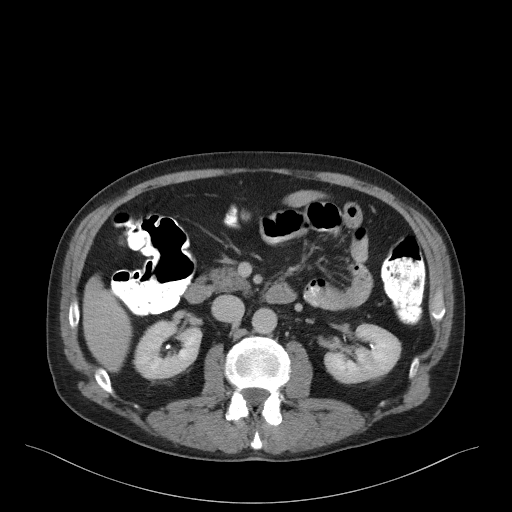

[16 of 46 positions shown; findings below may reference images not displayed]

FINDINGS: Lower chest: No acute abnormality.

Hepatobiliary: No focal liver abnormality is seen. No gallstones,
gallbladder wall thickening, or biliary dilatation.

Pancreas: Unremarkable. No pancreatic ductal dilatation or
surrounding inflammatory changes.

Spleen: Normal in size without focal abnormality.

Adrenals/Urinary Tract: Adrenal glands are unremarkable. Kidneys are
normal, without renal calculi, focal lesion, or hydronephrosis.
Bladder is unremarkable.

Stomach/Bowel: Stomach is within normal limits. Appendix appears
normal. No evidence of bowel wall thickening, distention, or
inflammatory changes. No obstructing colonic mass identified. Soft
tissue attenuating structure along the right side of rectum
approximately 7 cm from the verge measures 2.1 cm. This is
nonspecific in the setting of an un prepped colon.

Vascular/Lymphatic: Mild aortic atherosclerosis. No aneurysm. No
abdominopelvic adenopathy.

Reproductive: Prostate is unremarkable.

Other: No free fluid or fluid collection. No peritoneal disease
identified. Fat containing umbilical hernia.

Musculoskeletal: Bilateral L5 pars defects identified. No
significant anterolisthesis.Nonspecific sclerotic focus within the
right iliac bone measures 4 mm. Unchanged from 03/23/05 MRI
compatible with a benign abnormality. No acute or suspicious osseous
findings.
IMPRESSION: 1. No acute findings within the abdomen or pelvis. No findings to
suggest metastatic disease.
2. There is a small soft tissue attenuating structure along the
right lateral wall of the rectum measuring 2.1 cm and 7 cm from the
verge. This is nonspecific in the setting of an unprepped colon but
may represent the large polyp identified on colonoscopy.

## 2019-12-22 ENCOUNTER — Other Ambulatory Visit: Payer: Self-pay

## 2019-12-22 ENCOUNTER — Telehealth: Payer: Self-pay | Admitting: Gastroenterology

## 2019-12-22 DIAGNOSIS — R933 Abnormal findings on diagnostic imaging of other parts of digestive tract: Secondary | ICD-10-CM

## 2019-12-22 DIAGNOSIS — D128 Benign neoplasm of rectum: Secondary | ICD-10-CM

## 2019-12-22 DIAGNOSIS — Z86010 Personal history of colonic polyps: Secondary | ICD-10-CM

## 2019-12-22 NOTE — Telephone Encounter (Signed)
Flex scheduled for 8/23 at 1215 pm at [facility] with Dr Rush Landmark COVID test on 02/18/20.  Left message on machine to call back

## 2019-12-23 NOTE — Telephone Encounter (Signed)
Left message on machine to call back  

## 2019-12-23 NOTE — Telephone Encounter (Signed)
Flex scheduled, pt instructed and medications reviewed.  Patient instructions mailed to home.  Patient to call with any questions or concerns.  

## 2020-01-14 DIAGNOSIS — N5201 Erectile dysfunction due to arterial insufficiency: Secondary | ICD-10-CM | POA: Diagnosis not present

## 2020-01-14 DIAGNOSIS — R361 Hematospermia: Secondary | ICD-10-CM | POA: Diagnosis not present

## 2020-01-20 DIAGNOSIS — H0235 Blepharochalasis left lower eyelid: Secondary | ICD-10-CM | POA: Diagnosis not present

## 2020-02-16 DIAGNOSIS — H0015 Chalazion left lower eyelid: Secondary | ICD-10-CM | POA: Diagnosis not present

## 2020-02-18 ENCOUNTER — Other Ambulatory Visit (HOSPITAL_COMMUNITY)
Admission: RE | Admit: 2020-02-18 | Discharge: 2020-02-18 | Disposition: A | Discharge status: Home / Self Care | Payer: BC Managed Care – PPO | Source: Ambulatory Visit | Attending: Gastroenterology | Admitting: Gastroenterology

## 2020-02-18 DIAGNOSIS — Z20822 Contact with and (suspected) exposure to covid-19: Secondary | ICD-10-CM | POA: Insufficient documentation

## 2020-02-18 DIAGNOSIS — Z01812 Encounter for preprocedural laboratory examination: Secondary | ICD-10-CM | POA: Insufficient documentation

## 2020-02-18 LAB — SARS CORONAVIRUS 2 (TAT 6-24 HRS): SARS Coronavirus 2: NEGATIVE

## 2020-02-19 ENCOUNTER — Encounter (HOSPITAL_COMMUNITY): Payer: Self-pay | Admitting: Gastroenterology

## 2020-02-19 ENCOUNTER — Other Ambulatory Visit: Payer: Self-pay

## 2020-02-19 NOTE — Progress Notes (Signed)
Pt denies SOB, chest pain, and being under the care of a cardiologist. Pt denies having a stress test, echo and cardiac cath. Pt denies having an EKG and chest x ray. Pt denies recent labs. Pt made aware to stop taking  Aspirin (unless otherwise advised by surgeon), vitamins, fish oil and herbal medications. Do not take any NSAIDs ie: Ibuprofen, Advil, Naproxen (Aleve), Motrin, BC and Goody Powder. Pt reminded to quarantine. Pt verbalized understanding of all pre-op instructions.

## 2020-02-22 ENCOUNTER — Encounter (HOSPITAL_COMMUNITY)
Admission: RE | Disposition: A | Discharge status: Home / Self Care | Payer: Self-pay | Source: Home / Self Care | Attending: Gastroenterology

## 2020-02-22 ENCOUNTER — Ambulatory Visit (HOSPITAL_COMMUNITY): Payer: BC Managed Care – PPO | Admitting: Certified Registered Nurse Anesthetist

## 2020-02-22 ENCOUNTER — Other Ambulatory Visit: Payer: Self-pay

## 2020-02-22 ENCOUNTER — Encounter (HOSPITAL_COMMUNITY): Payer: Self-pay | Admitting: Gastroenterology

## 2020-02-22 ENCOUNTER — Ambulatory Visit (HOSPITAL_COMMUNITY)
Admission: RE | Admit: 2020-02-22 | Discharge: 2020-02-22 | Disposition: A | Discharge status: Home / Self Care | Payer: BC Managed Care – PPO | Attending: Gastroenterology | Admitting: Gastroenterology

## 2020-02-22 DIAGNOSIS — Z85828 Personal history of other malignant neoplasm of skin: Secondary | ICD-10-CM | POA: Insufficient documentation

## 2020-02-22 DIAGNOSIS — K641 Second degree hemorrhoids: Secondary | ICD-10-CM | POA: Insufficient documentation

## 2020-02-22 DIAGNOSIS — Z86010 Personal history of colonic polyps: Secondary | ICD-10-CM | POA: Diagnosis not present

## 2020-02-22 DIAGNOSIS — K6289 Other specified diseases of anus and rectum: Secondary | ICD-10-CM | POA: Diagnosis not present

## 2020-02-22 DIAGNOSIS — K635 Polyp of colon: Secondary | ICD-10-CM | POA: Insufficient documentation

## 2020-02-22 DIAGNOSIS — Z1211 Encounter for screening for malignant neoplasm of colon: Secondary | ICD-10-CM | POA: Diagnosis not present

## 2020-02-22 DIAGNOSIS — D125 Benign neoplasm of sigmoid colon: Secondary | ICD-10-CM | POA: Diagnosis not present

## 2020-02-22 DIAGNOSIS — D128 Benign neoplasm of rectum: Secondary | ICD-10-CM | POA: Diagnosis not present

## 2020-02-22 DIAGNOSIS — R933 Abnormal findings on diagnostic imaging of other parts of digestive tract: Secondary | ICD-10-CM

## 2020-02-22 HISTORY — PX: COLONOSCOPY WITH PROPOFOL: SHX5780

## 2020-02-22 HISTORY — PX: ENDOSCOPIC MUCOSAL RESECTION: SHX6839

## 2020-02-22 HISTORY — PX: POLYPECTOMY: SHX5525

## 2020-02-22 HISTORY — DX: Benign neoplasm of rectum: D12.8

## 2020-02-22 HISTORY — PX: SUBMUCOSAL LIFTING INJECTION: SHX6855

## 2020-02-22 SURGERY — RESECTION, MUCOSAL LESION, GI TRACT, ENDOSCOPIC
Anesthesia: Monitor Anesthesia Care

## 2020-02-22 MED ORDER — LACTATED RINGERS IV SOLN
INTRAVENOUS | Status: AC | PRN
  Administered 2020-02-22: 10 mL/h via INTRAVENOUS

## 2020-02-22 MED ORDER — PROPOFOL 10 MG/ML IV BOLUS
INTRAVENOUS | Status: DC | PRN
  Administered 2020-02-22: 20 mg via INTRAVENOUS

## 2020-02-22 MED ORDER — PROPOFOL 500 MG/50ML IV EMUL
INTRAVENOUS | Status: DC | PRN
  Administered 2020-02-22: 130 ug/kg/min via INTRAVENOUS
  Administered 2020-02-22: 125 ug/kg/min via INTRAVENOUS

## 2020-02-22 MED ORDER — SODIUM CHLORIDE 0.9 % IV SOLN: INTRAVENOUS | Status: DC

## 2020-02-22 NOTE — H&P (Signed)
GASTROENTEROLOGY PROCEDURE H&P NOTE   Primary Care Physician: Velna Hatchet, MD  HPI: Rodney Whitney is a 64 y.o. male who presents for Flexible sigmoidoscopy/Colonoscopy with follow up of large Rectosigmoid TA with HGD s/p EMR last year.  Past Medical History:  Diagnosis Date  . Allergy   . Rectal adenoma    colon polyp ( copied from order for consent)  . Seasonal allergies   . Skin cancer    right ear   Past Surgical History:  Procedure Laterality Date  . bone spur Left    foot  . ENDOSCOPIC MUCOSAL RESECTION N/A 07/09/2019   Procedure: ENDOSCOPIC MUCOSAL RESECTION;  Surgeon: Rush Landmark Telford Nab., MD;  Location: [place];  Service: Gastroenterology;  Laterality: N/A;  . FLEXIBLE SIGMOIDOSCOPY N/A 07/09/2019   Procedure: FLEXIBLE SIGMOIDOSCOPY;  Surgeon: Rush Landmark Telford Nab., MD;  Location: [place];  Service: Gastroenterology;  Laterality: N/A;  . HEMOSTASIS CLIP PLACEMENT  07/09/2019   Procedure: HEMOSTASIS CLIP PLACEMENT;  Surgeon: Irving Copas., MD;  Location: [place];  Service: Gastroenterology;;  . POLYPECTOMY  07/09/2019   Procedure: POLYPECTOMY;  Surgeon: Irving Copas., MD;  Location: [place];  Service: Gastroenterology;;  . Lia Foyer LIFTING INJECTION  07/09/2019   Procedure: SUBMUCOSAL LIFTING INJECTION;  Surgeon: Irving Copas., MD;  Location: [place];  Service: Gastroenterology;;  . torn meniscus Right    knee  . VASECTOMY     Current Facility-Administered Medications  Medication Dose Route Frequency Provider Last Rate Last Admin  . 0.9 %  sodium chloride infusion   Intravenous Continuous Mansouraty, Telford Nab., MD      . lactated ringers infusion    Continuous PRN Mansouraty, Telford Nab., MD 10 mL/hr at 02/22/20 1305 Restarted at 02/22/20 1311   Facility-Administered Medications Ordered in Other Encounters  Medication Dose Route Frequency Provider Last Rate Last Admin  . propofol (DIPRIVAN) 10 mg/mL bolus/IV  push   Intravenous Anesthesia Intra-op Inda Coke, CRNA   20 mg at 02/22/20 1311  . propofol (DIPRIVAN) 500 MG/50ML infusion   Intravenous Continuous PRN Inda Coke, CRNA 68.025 mL/hr at 02/22/20 1311 125 mcg/kg/min at 02/22/20 1311   No Known Allergies Family History  Problem Relation Age of Onset  . Colon cancer Neg Hx   . Esophageal cancer Neg Hx   . Rectal cancer Neg Hx   . Stomach cancer Neg Hx   . Inflammatory bowel disease Neg Hx   . Liver cancer Neg Hx   . Pancreatic cancer Neg Hx    Social History   Socioeconomic History  . Marital status: Married    Spouse name: Not on file  . Number of children: Not on file  . Years of education: Not on file  . Highest education level: Not on file  Occupational History  . Not on file  Tobacco Use  . Smoking status: Never Smoker  . Smokeless tobacco: Never Used  Vaping Use  . Vaping Use: Never used  Substance and Sexual Activity  . Alcohol use: Yes    Alcohol/week: 12.0 standard drinks    Types: 12 Cans of beer per week    Comment: occas  . Drug use: Never  . Sexual activity: Not on file  Other Topics Concern  . Not on file  Social History Narrative  . Not on file   Social Determinants of Health   Financial Resource Strain:   . Difficulty of Paying Living Expenses: Not on file  Food Insecurity:   . Worried  About Running Out of Food in the Last Year: Not on file  . Ran Out of Food in the Last Year: Not on file  Transportation Needs:   . Lack of Transportation (Medical): Not on file  . Lack of Transportation (Non-Medical): Not on file  Physical Activity:   . Days of Exercise per Week: Not on file  . Minutes of Exercise per Session: Not on file  Stress:   . Feeling of Stress : Not on file  Social Connections:   . Frequency of Communication with Friends and Family: Not on file  . Frequency of Social Gatherings with Friends and Family: Not on file  . Attends Religious Services: Not on file  . Active Member of  Clubs or Organizations: Not on file  . Attends Archivist Meetings: Not on file  . Marital Status: Not on file  Intimate Partner Violence:   . Fear of Current or Ex-Partner: Not on file  . Emotionally Abused: Not on file  . Physically Abused: Not on file  . Sexually Abused: Not on file    Physical Exam: Vital signs in last 24 hours: Temp:  [98.1 F (36.7 C)] 98.1 F (36.7 C) (08/23 1135) Pulse Rate:  [72] 72 (08/23 1135) Resp:  [18] 18 (08/23 1135) BP: (170)/(86) 170/86 (08/23 1135) SpO2:  [99 %] 99 % (08/23 1135) Weight:  [90.7 kg] 90.7 kg (08/23 1135)   GEN: NAD EYE: Sclerae anicteric ENT: MMM CV: Non-tachycardic GI: Soft, NT/ND NEURO:  Alert & Oriented x 3  Lab Results: No results for input(s): WBC, HGB, HCT, PLT in the last 72 hours. BMET No results for input(s): NA, K, CL, CO2, GLUCOSE, BUN, CREATININE, CALCIUM in the last 72 hours. LFT No results for input(s): PROT, ALBUMIN, AST, ALT, ALKPHOS, BILITOT, BILIDIR, IBILI in the last 72 hours. PT/INR No results for input(s): LABPROT, INR in the last 72 hours.   Impression / Plan: This is a 64 y.o.male who presents for Flexible sigmoidoscopy/Colonoscopy with follow up of large Rectosigmoid TA with HGD s/p EMR last year.  The risks and benefits of endoscopic evaluation were discussed with the patient; these include but are not limited to the risk of perforation, infection, bleeding, missed lesions, lack of diagnosis, severe illness requiring hospitalization, as well as anesthesia and sedation related illnesses.  The patient is agreeable to proceed.    Justice Britain, MD [facility] Office # [phone]

## 2020-02-22 NOTE — Anesthesia Postprocedure Evaluation (Signed)
Anesthesia Post Note  Patient: Rodney Whitney  Procedure(s) Performed: ENDOSCOPIC MUCOSAL RESECTION (N/A ) COLONOSCOPY WITH PROPOFOL (N/A ) POLYPECTOMY SUBMUCOSAL LIFTING INJECTION     Patient location during evaluation: PACU Anesthesia Type: MAC Level of consciousness: awake and alert Pain management: pain level controlled Vital Signs Assessment: post-procedure vital signs reviewed and stable Respiratory status: spontaneous breathing, nonlabored ventilation and respiratory function stable Cardiovascular status: stable and blood pressure returned to baseline Postop Assessment: no apparent nausea or vomiting Anesthetic complications: no   No complications documented.  Last Vitals:  Vitals:   02/22/20 1418 02/22/20 1427  BP: 125/71 (!) 149/78  Pulse: 70 76  Resp: 17 15  Temp:    SpO2: 99% 99%    Last Pain:  Vitals:   02/22/20 1427  TempSrc:   PainSc: 0-No pain                 Merlinda Frederick

## 2020-02-22 NOTE — Op Note (Signed)
[facility] Patient Name: Rodney Whitney Procedure Date : 02/22/2020 MRN: [id number] Attending MD: Justice Britain , MD Date of Birth: 1956/03/08 CSN: [id number] Age: 64 Admit Type: Outpatient Procedure:                Colonoscopy Indications:              Surveillance: Personal history of large removal of                            adenoma on last colonoscopy (1 year ago in the                            proximal rectum/rectosigmoid) Providers:                Justice Britain, MD, Carlyn Reichert, RN, Janee Morn, Technician Referring MD:             Carlota Raspberry. Havery Moros, MD Medicines:                Monitored Anesthesia Care Complications:            No immediate complications. Estimated Blood Loss:     Estimated blood loss was minimal. Procedure:                Pre-Anesthesia Assessment:                           - Prior to the procedure, a History and Physical                            was performed, and patient medications and                            allergies were reviewed. The patient's tolerance of                            previous anesthesia was also reviewed. The risks                            and benefits of the procedure and the sedation                            options and risks were discussed with the patient.                            All questions were answered, and informed consent                            was obtained. Prior Anticoagulants: The patient has                            taken no previous anticoagulant or antiplatelet  agents. ASA Grade Assessment: II - A patient with                            mild systemic disease. After reviewing the risks                            and benefits, the patient was deemed in                            satisfactory condition to undergo the procedure.                           After obtaining informed consent, the colonoscope                             was passed under direct vision. Throughout the                            procedure, the patient's blood pressure, pulse, and                            oxygen saturations were monitored continuously. The                            [id number]-HQ190L ([id number]) Olympus colonoscope was                            introduced through the anus and advanced to the the                            cecum, identified by appendiceal orifice and                            ileocecal valve. After obtaining informed consent,                            the colonoscope was passed under direct vision.                            Throughout the procedure, the patient's blood                            pressure, pulse, and oxygen saturations were                            monitored continuously.The colonoscopy was                            performed without difficulty. The patient tolerated                            the procedure. The quality of the bowel preparation  was fair. The ileocecal valve, appendiceal orifice,                            and rectum were photographed. Scope In: 1:38:54 PM Scope Out: 2:00:40 PM Scope Withdrawal Time: 0 hours 18 minutes 11 seconds  Total Procedure Duration: 0 hours 21 minutes 46 seconds  Findings:      The digital rectal exam findings include hemorrhoids. Pertinent       negatives include no palpable rectal lesions.      A large amount of semi-liquid stool was found at the hepatic flexure and       in the ascending colon, interfering with visualization. Lavage of the       area was performed using copious amounts, resulting in clearance with       fair visualization.      A 7 mm polyp was found in the sigmoid colon. The polyp was sessile. The       polyp was removed with a cold snare. Resection and retrieval were       complete.      A medium post mucosectomy scar was found in the proximal rectum. There       was residual polypoid tissue  present. Preparations were made for mucosal       resection. White-light and NBI imaging was done to mark the borders of       the lesion. Saline was injected to raise the lesion. Piecemeal mucosal       resection using a snare was performed. Resection and retrieval were       complete.      Non-bleeding non-thrombosed internal hemorrhoids were found during       retroflexion, during perianal exam and during digital exam. The       hemorrhoids were Grade II (internal hemorrhoids that prolapse but reduce       spontaneously). Impression:               - Preparation of the colon was fair.                           - Hemorrhoids found on digital rectal exam.                           - Stool at the hepatic flexure and in the ascending                            colon.                           - One 7 mm polyp in the sigmoid colon, removed with                            a cold snare. Resected and retrieved.                           - Post mucosectomy scar in the proximal rectum with                            evidence of some small polypoid tissue.                           -  Non-bleeding non-thrombosed internal hemorrhoids. Recommendation:           - The patient will be observed post-procedure,                            until all discharge criteria are met.                           - Discharge patient to home.                           - Patient has a contact number available for                            emergencies. The signs and symptoms of potential                            delayed complications were discussed with the                            patient. Return to normal activities tomorrow.                            Written discharge instructions were provided to the                            patient.                           - Resume previous diet.                           - Continue present medications.                           - No aspirin, ibuprofen, naproxen, or  other                            non-steroidal anti-inflammatory drugs for 2 weeks.                           - Await pathology results.                           - Repeat colonoscopy for surveillance based on                            pathology results.                           - The findings and recommendations were discussed                            with the patient.                           - The findings and recommendations were discussed  with the patient's family. Procedure Code(s):        --- Professional ---                           [id number], Colonoscopy, flexible; with endoscopic                            mucosal resection                           45385, 43, Colonoscopy, flexible; with removal of                            tumor(s), polyp(s), or other lesion(s) by snare                            technique Diagnosis Code(s):        --- Professional ---                           K64.1, Second degree hemorrhoids                           K63.5, Polyp of colon                           Z98.890, Other specified postprocedural states                           Z09, Encounter for follow-up examination after                            completed treatment for conditions other than                            malignant neoplasm                           Z86.010, Personal history of colonic polyps CPT copyright 2019 [employer]. All rights reserved. The codes documented in this report are preliminary and upon coder review may  be revised to meet current compliance requirements. Justice Britain, MD 02/22/2020 2:19:57 PM Number of Addenda: 0

## 2020-02-22 NOTE — Anesthesia Preprocedure Evaluation (Addendum)
Anesthesia Evaluation  Patient identified by MRN, date of birth, ID band Patient awake    Reviewed: Allergy & Precautions, NPO status , Patient's Chart, lab work & pertinent test results  Airway Mallampati: II  TM Distance: >3 FB Neck ROM: Full    Dental no notable dental hx.    Pulmonary neg pulmonary ROS,    Pulmonary exam normal breath sounds clear to auscultation       Cardiovascular negative cardio ROS Normal cardiovascular exam Rhythm:Regular Rate:Normal     Neuro/Psych negative neurological ROS  negative psych ROS   GI/Hepatic Neg liver ROS, Rectal adenoma   Endo/Other  negative endocrine ROS  Renal/GU negative Renal ROS  negative genitourinary   Musculoskeletal negative musculoskeletal ROS (+)   Abdominal   Peds negative pediatric ROS (+)  Hematology negative hematology ROS (+)   Anesthesia Other Findings   Reproductive/Obstetrics negative OB ROS                             Anesthesia Physical  Anesthesia Plan  ASA: II  Anesthesia Plan: MAC   Post-op Pain Management:    Induction: Intravenous  PONV Risk Score and Plan: 1 and Ondansetron  Airway Management Planned: Simple Face Mask  Additional Equipment:   Intra-op Plan:   Post-operative Plan:   Informed Consent: I have reviewed the patients History and Physical, chart, labs and discussed the procedure including the risks, benefits and alternatives for the proposed anesthesia with the patient or authorized representative who has indicated his/her understanding and acceptance.     Dental advisory given  Plan Discussed with: CRNA  Anesthesia Plan Comments:         Anesthesia Quick Evaluation

## 2020-02-22 NOTE — Discharge Instructions (Signed)
YOU HAD AN ENDOSCOPIC PROCEDURE TODAY: Refer to the procedure report and other information in the discharge instructions given to you for any specific questions about what was found during the examination. If this information does not answer your questions, please call [place] office at [phone] to clarify.  ° °YOU SHOULD EXPECT: Some feelings of bloating in the abdomen. Passage of more gas than usual. Walking can help get rid of the air that was put into your GI tract during the procedure and reduce the bloating. If you had a lower endoscopy (such as a colonoscopy or flexible sigmoidoscopy) you may notice spotting of blood in your stool or on the toilet paper. Some abdominal soreness may be present for a day or two, also. ° °DIET: Your first meal following the procedure should be a light meal and then it is ok to progress to your normal diet. A half-sandwich or bowl of soup is an example of a good first meal. Heavy or fried foods are harder to digest and may make you feel nauseous or bloated. Drink plenty of fluids but you should avoid alcoholic beverages for 24 hours. If you had a esophageal dilation, please see attached instructions for diet.   ° °ACTIVITY: Your care partner should take you home directly after the procedure. You should plan to take it easy, moving slowly for the rest of the day. You can resume normal activity the day after the procedure however YOU SHOULD NOT DRIVE, use power tools, machinery or perform tasks that involve climbing or major physical exertion for 24 hours (because of the sedation medicines used during the test).  ° °SYMPTOMS TO REPORT IMMEDIATELY: °A gastroenterologist can be reached at any hour. Please call [phone]  for any of the following symptoms:  °Following lower endoscopy (colonoscopy, flexible sigmoidoscopy) °Excessive amounts of blood in the stool  °Significant tenderness, worsening of abdominal pains  °Swelling of the abdomen that is new, acute  °Fever of 100° or  higher  °Following upper endoscopy (EGD, EUS, ERCP, esophageal dilation) °Vomiting of blood or coffee ground material  °New, significant abdominal pain  °New, significant chest pain or pain under the shoulder blades  °Painful or persistently difficult swallowing  °New shortness of breath  °Black, tarry-looking or red, bloody stools ° °FOLLOW UP:  °If any biopsies were taken you will be contacted by phone or by letter within the next 1-3 weeks. Call [phone]  if you have not heard about the biopsies in 3 weeks.  °Please also call with any specific questions about appointments or follow up tests. ° °

## 2020-02-22 NOTE — Transfer of Care (Signed)
Immediate Anesthesia Transfer of Care Note  Patient: Rodney Whitney  Procedure(s) Performed: ENDOSCOPIC MUCOSAL RESECTION (N/A ) COLONOSCOPY WITH PROPOFOL (N/A ) POLYPECTOMY SUBMUCOSAL LIFTING INJECTION  Patient Location: Endoscopy Unit  Anesthesia Type:MAC  Level of Consciousness: awake and drowsy  Airway & Oxygen Therapy: Patient Spontanous Breathing and Patient connected to nasal cannula oxygen  Post-op Assessment: Report given to RN and Post -op Vital signs reviewed and stable  Post vital signs: Reviewed and stable  Last Vitals:  Vitals Value Taken Time  BP 94/56 02/22/20 1407  Temp 36.2 C 02/22/20 1407  Pulse 56 02/22/20 1412  Resp 19 02/22/20 1412  SpO2 96 % 02/22/20 1412  Vitals shown include unvalidated device data.  Last Pain:  Vitals:   02/22/20 1407  TempSrc: Temporal  PainSc:          Complications: No complications documented.

## 2020-02-22 NOTE — Anesthesia Procedure Notes (Signed)
Procedure Name: MAC Date/Time: 02/22/2020 1:06 PM Performed by: Inda Coke, CRNA Pre-anesthesia Checklist: Patient identified, Emergency Drugs available, Suction available, Timeout performed and Patient being monitored Patient Re-evaluated:Patient Re-evaluated prior to induction Oxygen Delivery Method: Nasal cannula Induction Type: IV induction Dental Injury: Teeth and Oropharynx as per pre-operative assessment

## 2020-02-23 ENCOUNTER — Encounter (HOSPITAL_COMMUNITY): Payer: Self-pay | Admitting: Gastroenterology

## 2020-02-23 ENCOUNTER — Encounter: Payer: Self-pay | Admitting: Gastroenterology

## 2020-02-23 LAB — SURGICAL PATHOLOGY

## 2020-02-23 NOTE — Progress Notes (Signed)
Attempted, unable to leave message/bt 

## 2020-03-03 DIAGNOSIS — R361 Hematospermia: Secondary | ICD-10-CM | POA: Diagnosis not present

## 2020-03-03 DIAGNOSIS — N5201 Erectile dysfunction due to arterial insufficiency: Secondary | ICD-10-CM | POA: Diagnosis not present

## 2020-03-08 NOTE — Telephone Encounter (Signed)
ERROR

## 2020-04-16 ENCOUNTER — Ambulatory Visit: Payer: BC Managed Care – PPO | Attending: Internal Medicine

## 2020-04-16 DIAGNOSIS — Z23 Encounter for immunization: Secondary | ICD-10-CM

## 2020-04-16 NOTE — Progress Notes (Signed)
   Covid-19 Vaccination Clinic  Name:  Rodney Whitney    MRN: [id number] DOB: April 11, 1956  04/16/2020  Mr. Rodney Whitney was observed post Covid-19 immunization for 15 minutes without incident. He was provided with Vaccine Information Sheet and instruction to access the V-Safe system.   Mr. Rodney Whitney was instructed to call 911 with any severe reactions post vaccine: Marland Kitchen Difficulty breathing  . Swelling of face and throat  . A fast heartbeat  . A bad rash all over body  . Dizziness and weakness

## 2021-01-05 ENCOUNTER — Other Ambulatory Visit: Payer: Self-pay

## 2021-01-05 ENCOUNTER — Other Ambulatory Visit (HOSPITAL_BASED_OUTPATIENT_CLINIC_OR_DEPARTMENT_OTHER): Payer: Self-pay

## 2021-01-05 ENCOUNTER — Ambulatory Visit: Payer: BC Managed Care – PPO | Attending: Internal Medicine

## 2021-01-05 DIAGNOSIS — Z23 Encounter for immunization: Secondary | ICD-10-CM

## 2021-01-05 MED ORDER — PFIZER-BIONT COVID-19 VAC-TRIS 30 MCG/0.3ML IM SUSP
INTRAMUSCULAR | 0 refills | Status: AC
  Filled 2021-01-05: qty 0.3, 1d supply, fill #0

## 2021-04-14 ENCOUNTER — Other Ambulatory Visit (HOSPITAL_BASED_OUTPATIENT_CLINIC_OR_DEPARTMENT_OTHER): Payer: Self-pay

## 2021-04-14 ENCOUNTER — Other Ambulatory Visit: Payer: Self-pay

## 2021-04-14 ENCOUNTER — Ambulatory Visit: Payer: Self-pay | Attending: Internal Medicine

## 2021-04-14 DIAGNOSIS — Z23 Encounter for immunization: Secondary | ICD-10-CM

## 2021-04-14 MED ORDER — PFIZER COVID-19 VAC BIVALENT 30 MCG/0.3ML IM SUSP
INTRAMUSCULAR | 0 refills | Status: AC
  Filled 2021-04-14: qty 0.3, 1d supply, fill #0

## 2021-04-14 NOTE — Progress Notes (Signed)
   Covid-19 Vaccination Clinic  Name:  Rodney Whitney    MRN: [id number] DOB: August 23, 1955  04/14/2021  Rodney Whitney was observed post Covid-19 immunization for 15 minutes without incident. He was provided with Vaccine Information Sheet and instruction to access the V-Safe system.   Rodney Whitney was instructed to call 911 with any severe reactions post vaccine: Difficulty breathing  Swelling of face and throat  A fast heartbeat  A bad rash all over body  Dizziness and weakness

## 2021-05-05 ENCOUNTER — Other Ambulatory Visit (HOSPITAL_BASED_OUTPATIENT_CLINIC_OR_DEPARTMENT_OTHER): Payer: Self-pay

## 2021-05-05 MED ORDER — FLUAD QUADRIVALENT 0.5 ML IM PRSY
PREFILLED_SYRINGE | INTRAMUSCULAR | 0 refills | Status: AC
  Filled 2021-05-05: qty 0.5, 1d supply, fill #0

## 2022-06-01 ENCOUNTER — Other Ambulatory Visit (HOSPITAL_BASED_OUTPATIENT_CLINIC_OR_DEPARTMENT_OTHER): Payer: Self-pay

## 2022-06-01 MED ORDER — COMIRNATY 30 MCG/0.3ML IM SUSY
PREFILLED_SYRINGE | INTRAMUSCULAR | 0 refills | Status: AC
  Filled 2022-06-01: qty 0.3, 1d supply, fill #0

## 2022-06-13 ENCOUNTER — Other Ambulatory Visit (HOSPITAL_BASED_OUTPATIENT_CLINIC_OR_DEPARTMENT_OTHER): Payer: Self-pay

## 2023-01-04 ENCOUNTER — Encounter: Payer: Self-pay | Admitting: Gastroenterology

## 2023-02-06 ENCOUNTER — Encounter: Payer: Self-pay | Admitting: Gastroenterology

## 2023-03-13 ENCOUNTER — Ambulatory Visit (AMBULATORY_SURGERY_CENTER): Payer: Self-pay

## 2023-03-13 VITALS — Ht 72.0 in | Wt 200.0 lb

## 2023-03-13 DIAGNOSIS — Z86010 Personal history of colonic polyps: Secondary | ICD-10-CM

## 2023-03-13 MED ORDER — PEG 3350-KCL-NA BICARB-NACL 420 G PO SOLR: 4000.0000 mL | Freq: Once | ORAL | 0 refills | Status: AC

## 2023-03-13 NOTE — Progress Notes (Signed)
No egg or soy allergy known to patient  No issues known to pt with past sedation with any surgeries or procedures Patient denies ever being told they had issues or difficulty with intubation  No FH of Malignant Hyperthermia Pt is not on diet pills Pt is not on  home 02  Pt is not on blood thinners  Pt denies issues with constipation  No A fib or A flutter Have any cardiac testing pending--no  LOA: independent  Prep: 2 day Golytely   Patient's chart reviewed by Cathlyn Parsons CNRA prior to previsit and patient appropriate for the [facility].  Previsit completed and red dot placed by patient's name on their procedure day (on provider's schedule).     PV competed with patient. Prep instructions sent via mychart and home address.

## 2023-03-28 ENCOUNTER — Encounter: Payer: Self-pay | Admitting: Gastroenterology

## 2023-04-09 ENCOUNTER — Ambulatory Visit (AMBULATORY_SURGERY_CENTER): Payer: Medicare Other | Admitting: Gastroenterology

## 2023-04-09 ENCOUNTER — Encounter: Payer: Self-pay | Admitting: Gastroenterology

## 2023-04-09 VITALS — BP 128/78 | HR 72 | Temp 98.0°F | Resp 14 | Ht 72.0 in | Wt 200.0 lb

## 2023-04-09 DIAGNOSIS — Z860100 Personal history of colon polyps, unspecified: Secondary | ICD-10-CM

## 2023-04-09 DIAGNOSIS — Z1211 Encounter for screening for malignant neoplasm of colon: Secondary | ICD-10-CM | POA: Diagnosis not present

## 2023-04-09 DIAGNOSIS — Z09 Encounter for follow-up examination after completed treatment for conditions other than malignant neoplasm: Secondary | ICD-10-CM | POA: Diagnosis not present

## 2023-04-09 MED ORDER — SODIUM CHLORIDE 0.9 % IV SOLN: 500.0000 mL | Freq: Once | INTRAVENOUS | Status: DC

## 2023-04-09 NOTE — Progress Notes (Signed)
Sedate, gd SR, tolerated procedure well, VSS, report to RN 

## 2023-04-09 NOTE — Progress Notes (Signed)
Pt's states no medical or surgical changes since previsit or office visit. 

## 2023-04-09 NOTE — Progress Notes (Signed)
[facility] History and Physical   Primary Care Physician:  Alysia Penna, MD   Reason for Procedure:   History of colon polyps  Plan:    colonoscopy     HPI: Rodney Whitney is a 67 y.o. male  here for colonoscopy surveillance - large advanced adenoma removed from the rectum 1/21 per Dr. Barbarann Ehlers, also with history of other small polyps..   Patient denies any bowel symptoms at this time. No family history of colon cancer known. Otherwise feels well without any cardiopulmonary symptoms.   I have discussed risks / benefits of anesthesia and endoscopic procedure with Susanne Borders and they wish to proceed with the exams as outlined today.    Past Medical History:  Diagnosis Date   Allergy    Rectal adenoma    colon polyp ( copied from order for consent)   Seasonal allergies    Skin cancer    right ear    Past Surgical History:  Procedure Laterality Date   bone spur Left    foot   COLONOSCOPY WITH PROPOFOL N/A 02/22/2020   Procedure: COLONOSCOPY WITH PROPOFOL;  Surgeon: Lemar Lofty., MD;  Location: [facility] ENDOSCOPY;  Service: Gastroenterology;  Laterality: N/A;   ENDOSCOPIC MUCOSAL RESECTION N/A 07/09/2019   Procedure: ENDOSCOPIC MUCOSAL RESECTION;  Surgeon: Meridee Score Netty Starring., MD;  Location: [facility] ENDOSCOPY;  Service: Gastroenterology;  Laterality: N/A;   ENDOSCOPIC MUCOSAL RESECTION N/A 02/22/2020   Procedure: ENDOSCOPIC MUCOSAL RESECTION;  Surgeon: Meridee Score Netty Starring., MD;  Location: [facility] ENDOSCOPY;  Service: Gastroenterology;  Laterality: N/A;   FLEXIBLE SIGMOIDOSCOPY N/A 07/09/2019   Procedure: FLEXIBLE SIGMOIDOSCOPY;  Surgeon: Meridee Score Netty Starring., MD;  Location: [facility] ENDOSCOPY;  Service: Gastroenterology;  Laterality: N/A;   HEMOSTASIS CLIP PLACEMENT  07/09/2019   Procedure: HEMOSTASIS CLIP PLACEMENT;  Surgeon: Lemar Lofty., MD;  Location: [facility] ENDOSCOPY;  Service: Gastroenterology;;   POLYPECTOMY  07/09/2019   Procedure: POLYPECTOMY;  Surgeon:  Lemar Lofty., MD;  Location: [facility] ENDOSCOPY;  Service: Gastroenterology;;   POLYPECTOMY  02/22/2020   Procedure: POLYPECTOMY;  Surgeon: Lemar Lofty., MD;  Location: [facility] ENDOSCOPY;  Service: Gastroenterology;;   SUBMUCOSAL LIFTING INJECTION  07/09/2019   Procedure: SUBMUCOSAL LIFTING INJECTION;  Surgeon: Lemar Lofty., MD;  Location: [facility] ENDOSCOPY;  Service: Gastroenterology;;   SUBMUCOSAL LIFTING INJECTION  02/22/2020   Procedure: SUBMUCOSAL LIFTING INJECTION;  Surgeon: Lemar Lofty., MD;  Location: [facility] ENDOSCOPY;  Service: Gastroenterology;;   torn meniscus Right    knee   VASECTOMY      Prior to Admission medications   Medication Sig Start Date End Date Taking? Authorizing Provider  fluticasone (FLONASE) 50 MCG/ACT nasal spray Place 2 sprays into both nostrils daily.   Yes [provider]  COVID-19 mRNA bivalent vaccine, Pfizer, (PFIZER COVID-19 VAC BIVALENT) injection Inject into the muscle. 04/14/21   Judyann Munson, MD  COVID-19 mRNA Vac-TriS, Pfizer, (PFIZER-BIONT COVID-19 VAC-TRIS) SUSP injection Inject into the muscle. 01/05/21   Judyann Munson, MD  COVID-19 mRNA vaccine [phone] (COMIRNATY) syringe Inject into the muscle. 06/01/22     influenza vaccine adjuvanted (FLUAD QUADRIVALENT) 0.5 ML injection Inject into the muscle. 05/05/21       Current Outpatient Medications  Medication Sig Dispense Refill   fluticasone (FLONASE) 50 MCG/ACT nasal spray Place 2 sprays into both nostrils daily.     COVID-19 mRNA bivalent vaccine, Pfizer, (PFIZER COVID-19 VAC BIVALENT) injection Inject into the muscle. 0.3 mL 0   COVID-19 mRNA Vac-TriS, Pfizer, (PFIZER-BIONT COVID-19 VAC-TRIS) SUSP injection Inject  into the muscle. 0.3 mL 0   COVID-19 mRNA vaccine 2023-2024 (COMIRNATY) syringe Inject into the muscle. 0.3 mL 0   influenza vaccine adjuvanted (FLUAD QUADRIVALENT) 0.5 ML injection Inject into the muscle. 0.5 mL 0   Current Facility-Administered  Medications  Medication Dose Route Frequency Provider Last Rate Last Admin   0.9 %  sodium chloride infusion  500 mL Intravenous Once Jahari Wiginton, Willaim Rayas, MD        Allergies as of 04/09/2023   (No Known Allergies)    Family History  Problem Relation Age of Onset   Colon cancer Neg Hx    Esophageal cancer Neg Hx    Rectal cancer Neg Hx    Stomach cancer Neg Hx    Inflammatory bowel disease Neg Hx    Liver cancer Neg Hx    Pancreatic cancer Neg Hx     Social History   Socioeconomic History   Marital status: Married    Spouse name: Not on file   Number of children: Not on file   Years of education: Not on file   Highest education level: Not on file  Occupational History   Not on file  Tobacco Use   Smoking status: Never   Smokeless tobacco: Never  Vaping Use   Vaping status: Never Used  Substance and Sexual Activity   Alcohol use: Yes    Alcohol/week: 12.0 standard drinks of alcohol    Types: 12 Cans of beer per week    Comment: occas   Drug use: Never   Sexual activity: Not on file  Other Topics Concern   Not on file  Social History Narrative   Not on file   Social Determinants of Health   Financial Resource Strain: Not on file  Food Insecurity: Not on file  Transportation Needs: Not on file  Physical Activity: Not on file  Stress: Not on file  Social Connections: Not on file  Intimate Partner Violence: Not on file    Review of Systems: All other review of systems negative except as mentioned in the HPI.  Physical Exam: Vital signs BP (!) 167/88   Pulse 74   Temp 98 F (36.7 C)   Resp 12   Ht 6' (1.829 m)   Wt 200 lb (90.7 kg)   SpO2 99%   BMI 27.12 kg/m   General:   Alert,  Well-developed, pleasant and cooperative in NAD Lungs:  Clear throughout to auscultation.   Heart:  Regular rate and rhythm Abdomen:  Soft, nontender and nondistended.   Neuro/Psych:  Alert and cooperative. Normal mood and affect. A and O x 3  Harlin Rain,  MD [facility]

## 2023-04-09 NOTE — Op Note (Signed)
[facility] Patient Name: Rodney Whitney Procedure Date: 04/09/2023 8:45 AM MRN: [id number] Endoscopist: Viviann Spare P. Adela Lank , MD, [id number] Age: 67 Referring MD:  Date of Birth: Dec 28, 1955 Gender: Male Account #: [id number] Procedure:                Colonoscopy Indications:              High risk colon cancer surveillance: Personal                            history of colonic polyps - advanced rectal adenoma                            remove via EMR in 2021 Medicines:                Monitored Anesthesia Care Procedure:                Pre-Anesthesia Assessment:                           - Prior to the procedure, a History and Physical                            was performed, and patient medications and                            allergies were reviewed. The patient's tolerance of                            previous anesthesia was also reviewed. The risks                            and benefits of the procedure and the sedation                            options and risks were discussed with the patient.                            All questions were answered, and informed consent                            was obtained. Prior Anticoagulants: The patient has                            taken no anticoagulant or antiplatelet agents. ASA                            Grade Assessment: II - A patient with mild systemic                            disease. After reviewing the risks and benefits,                            the patient was deemed in satisfactory condition to  undergo the procedure.                           After obtaining informed consent, the colonoscope                            was passed under direct vision. Throughout the                            procedure, the patient's blood pressure, pulse, and                            oxygen saturations were monitored continuously. The                            Olympus Scope SN: [id number] was  introduced through                            the anus and advanced to the the cecum, identified                            by appendiceal orifice and ileocecal valve. The                            colonoscopy was performed without difficulty. The                            patient tolerated the procedure well. The quality                            of the bowel preparation was good. The ileocecal                            valve, appendiceal orifice, and rectum were                            photographed. Scope In: 8:54:14 AM Scope Out: 9:07:59 AM Scope Withdrawal Time: 0 hours 10 minutes 38 seconds  Total Procedure Duration: 0 hours 13 minutes 45 seconds  Findings:                 The perianal and digital rectal examinations were                            normal.                           Scattered small-mouthed diverticula were found in                            the entire colon.                           A large post polypectomy scar was found in the  proximal rectum. The scar tissue was healthy in                            appearance. There was no evidence of the previous                            polyp.                           Internal hemorrhoids were found during retroflexion.                           The exam was otherwise without abnormality. Complications:            No immediate complications. Estimated blood loss:                            None. Estimated Blood Loss:     Estimated blood loss: none. Impression:               - Diverticulosis in the entire examined colon.                           - Post-polypectomy scar in the proximal rectum.                           - Internal hemorrhoids.                           - The examination was otherwise normal.                           - No polyps. Recommendation:           - Patient has a contact number available for                            emergencies. The signs and symptoms of  potential                            delayed complications were discussed with the                            patient. Return to normal activities tomorrow.                            Written discharge instructions were provided to the                            patient.                           - Resume previous diet.                           - Continue present medications.                           -  Repeat colonoscopy in 5 years for surveillance                            given advanced lesion removed in 2021. Viviann Spare P. Maren Wiesen, MD 04/09/2023 9:12:57 AM This report has been signed electronically.

## 2023-04-09 NOTE — Patient Instructions (Signed)
Please read handouts provided. Continue present medications. Repeat colonoscopy in 5 years for screening.   YOU HAD AN ENDOSCOPIC PROCEDURE TODAY AT THE [facility]:   Refer to the procedure report that was given to you for any specific questions about what was found during the examination.  If the procedure report does not answer your questions, please call your gastroenterologist to clarify.  If you requested that your care partner not be given the details of your procedure findings, then the procedure report has been included in a sealed envelope for you to review at your convenience later.  YOU SHOULD EXPECT: Some feelings of bloating in the abdomen. Passage of more gas than usual.  Walking can help get rid of the air that was put into your GI tract during the procedure and reduce the bloating. If you had a lower endoscopy (such as a colonoscopy or flexible sigmoidoscopy) you may notice spotting of blood in your stool or on the toilet paper. If you underwent a bowel prep for your procedure, you may not have a normal bowel movement for a few days.  Please Note:  You might notice some irritation and congestion in your nose or some drainage.  This is from the oxygen used during your procedure.  There is no need for concern and it should clear up in a day or so.  SYMPTOMS TO REPORT IMMEDIATELY:  Following lower endoscopy (colonoscopy or flexible sigmoidoscopy):  Excessive amounts of blood in the stool  Significant tenderness or worsening of abdominal pains  Swelling of the abdomen that is new, acute  Fever of 100F or higher  For urgent or emergent issues, a gastroenterologist can be reached at any hour by calling [phone]. Do not use MyChart messaging for urgent concerns.    DIET:  We do recommend a small meal at first, but then you may proceed to your regular diet.  Drink plenty of fluids but you should avoid alcoholic beverages for 24 hours.  ACTIVITY:  You should plan  to take it easy for the rest of today and you should NOT DRIVE or use heavy machinery until tomorrow (because of the sedation medicines used during the test).    FOLLOW UP: Our staff will call the number listed on your records the next business day following your procedure.  We will call around 7:15- 8:00 am to check on you and address any questions or concerns that you may have regarding the information given to you following your procedure. If we do not reach you, we will leave a message.     If any biopsies were taken you will be contacted by phone or by letter within the next 1-3 weeks.  Please call us at [phone] if you have not heard about the biopsies in 3 weeks.    SIGNATURES/CONFIDENTIALITY: You and/or your care partner have signed paperwork which will be entered into your electronic medical record.  These signatures attest to the fact that that the information above on your After Visit Summary has been reviewed and is understood.  Full responsibility of the confidentiality of this discharge information lies with you and/or your care-partner. 

## 2023-04-10 ENCOUNTER — Telehealth: Payer: Self-pay

## 2023-04-10 NOTE — Telephone Encounter (Signed)
Left message on follow up call. 

## 2024-05-22 ENCOUNTER — Other Ambulatory Visit (HOSPITAL_BASED_OUTPATIENT_CLINIC_OR_DEPARTMENT_OTHER): Payer: Self-pay

## 2024-05-22 MED ORDER — FLUZONE HIGH-DOSE 0.5 ML IM SUSY
0.5000 mL | PREFILLED_SYRINGE | Freq: Once | INTRAMUSCULAR | 0 refills | Status: AC
  Filled 2024-05-22: qty 0.5, 1d supply, fill #0
